# Patient Record
Sex: Male | Born: 1990
Health system: Southern US, Community
[De-identification: ages and names within clinical notes are randomized; demographics above are authoritative.]

## PROBLEM LIST (undated history)

## (undated) DIAGNOSIS — F32A Depression, unspecified: Secondary | ICD-10-CM

## (undated) DIAGNOSIS — F419 Anxiety disorder, unspecified: Secondary | ICD-10-CM

## (undated) DIAGNOSIS — F988 Other specified behavioral and emotional disorders with onset usually occurring in childhood and adolescence: Secondary | ICD-10-CM

## (undated) DIAGNOSIS — F329 Major depressive disorder, single episode, unspecified: Secondary | ICD-10-CM

## (undated) DIAGNOSIS — T7840XA Allergy, unspecified, initial encounter: Secondary | ICD-10-CM

## (undated) HISTORY — PX: GASTRIC OUTLET OBSTRUCTION RELEASE: SHX5247

## (undated) HISTORY — DX: Anxiety disorder, unspecified: F41.9

## (undated) HISTORY — PX: LYMPHADENECTOMY: SHX15

## (undated) HISTORY — DX: Major depressive disorder, single episode, unspecified: F32.9

## (undated) HISTORY — DX: Other specified behavioral and emotional disorders with onset usually occurring in childhood and adolescence: F98.8

## (undated) HISTORY — PX: OTHER SURGICAL HISTORY: SHX169

## (undated) HISTORY — DX: Depression, unspecified: F32.A

## (undated) HISTORY — DX: Allergy, unspecified, initial encounter: T78.40XA

---
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
## 2010-01-05 ENCOUNTER — Emergency Department: Payer: Self-pay | Admitting: Emergency Medicine

## 2012-11-08 ENCOUNTER — Emergency Department: Payer: Self-pay | Admitting: Emergency Medicine

## 2012-11-08 LAB — DRUG SCREEN, URINE
Amphetamines, Ur Screen: NEGATIVE (ref ?–1000)
Cannabinoid 50 Ng, Ur ~~LOC~~: POSITIVE (ref ?–50)
Cocaine Metabolite,Ur ~~LOC~~: NEGATIVE (ref ?–300)
Methadone, Ur Screen: NEGATIVE (ref ?–300)
Phencyclidine (PCP) Ur S: NEGATIVE (ref ?–25)
Tricyclic, Ur Screen: NEGATIVE (ref ?–1000)

## 2012-11-08 LAB — CBC
HGB: 17.6 g/dL (ref 13.0–18.0)
Platelet: 321 10*3/uL (ref 150–440)
RDW: 13.4 % (ref 11.5–14.5)

## 2012-11-08 LAB — COMPREHENSIVE METABOLIC PANEL
Alkaline Phosphatase: 109 U/L (ref 50–136)
Anion Gap: 7 (ref 7–16)
BUN: 10 mg/dL (ref 7–18)
Bilirubin,Total: 0.8 mg/dL (ref 0.2–1.0)
Calcium, Total: 10 mg/dL (ref 8.5–10.1)
Co2: 27 mmol/L (ref 21–32)
EGFR (Non-African Amer.): >60
Glucose: 92 mg/dL (ref 65–99)
Osmolality: 273 (ref 275–301)
SGPT (ALT): 20 U/L (ref 12–78)
Sodium: 137 mmol/L (ref 136–145)

## 2012-11-08 LAB — URINALYSIS, COMPLETE
Blood: NEGATIVE
Glucose,UR: NEGATIVE mg/dL (ref 0–75)
Leukocyte Esterase: NEGATIVE
Nitrite: NEGATIVE
Ph: 7 (ref 4.5–8.0)
Protein: NEGATIVE
RBC,UR: 2 /HPF (ref 0–5)
Squamous Epithelial: NONE SEEN

## 2012-11-08 LAB — ACETAMINOPHEN LEVEL: Acetaminophen: <2 ug/mL

## 2012-11-08 LAB — ETHANOL
Ethanol %: <0.003 % (ref 0.000–0.080)
Ethanol: <3 mg/dL

## 2013-01-03 ENCOUNTER — Emergency Department: Payer: Self-pay | Admitting: Emergency Medicine

## 2013-03-20 ENCOUNTER — Emergency Department: Payer: Self-pay | Admitting: Emergency Medicine

## 2013-07-31 ENCOUNTER — Emergency Department: Payer: Self-pay | Admitting: Emergency Medicine

## 2013-07-31 LAB — BASIC METABOLIC PANEL
Anion Gap: 5 — ABNORMAL LOW (ref 7–16)
BUN: 16 mg/dL (ref 7–18)
CALCIUM: 9 mg/dL (ref 8.5–10.1)
CREATININE: 1.08 mg/dL (ref 0.60–1.30)
Chloride: 107 mmol/L (ref 98–107)
Co2: 27 mmol/L (ref 21–32)
EGFR (African American): >60
Glucose: 127 mg/dL — ABNORMAL HIGH (ref 65–99)
OSMOLALITY: 280 (ref 275–301)
POTASSIUM: 3.4 mmol/L — AB (ref 3.5–5.1)
SODIUM: 139 mmol/L (ref 136–145)

## 2013-07-31 LAB — CBC
HCT: 44.6 % (ref 40.0–52.0)
HGB: 14.5 g/dL (ref 13.0–18.0)
MCH: 25.3 pg — AB (ref 26.0–34.0)
MCHC: 32.6 g/dL (ref 32.0–36.0)
MCV: 78 fL — AB (ref 80–100)
Platelet: 256 10*3/uL (ref 150–440)
RBC: 5.75 10*6/uL (ref 4.40–5.90)
RDW: 13.5 % (ref 11.5–14.5)
WBC: 10.3 10*3/uL (ref 3.8–10.6)

## 2013-07-31 LAB — T4, FREE: FREE THYROXINE: 1.02 ng/dL (ref 0.76–1.46)

## 2013-07-31 LAB — TSH: Thyroid Stimulating Horm: 0.896 u[IU]/mL

## 2013-07-31 LAB — MAGNESIUM: Magnesium: 1.9 mg/dL

## 2013-09-30 ENCOUNTER — Emergency Department: Payer: Self-pay
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
## 2015-01-28 ENCOUNTER — Ambulatory Visit (INDEPENDENT_AMBULATORY_CARE_PROVIDER_SITE_OTHER): Payer: BLUE CROSS/BLUE SHIELD | Admitting: Family Medicine

## 2015-01-28 ENCOUNTER — Encounter: Payer: Self-pay | Admitting: Family Medicine

## 2015-01-28 VITALS — BP 102/64 | HR 73 | Temp 98.3°F | Ht 64.7 in | Wt 129.0 lb

## 2015-01-28 DIAGNOSIS — F32A Depression, unspecified: Secondary | ICD-10-CM | POA: Insufficient documentation

## 2015-01-28 DIAGNOSIS — F329 Major depressive disorder, single episode, unspecified: Secondary | ICD-10-CM | POA: Diagnosis not present

## 2015-01-28 LAB — URINALYSIS, ROUTINE W REFLEX MICROSCOPIC
Bilirubin, UA: NEGATIVE
Glucose, UA: NEGATIVE
KETONES UA: NEGATIVE
LEUKOCYTES UA: NEGATIVE
Nitrite, UA: NEGATIVE
Protein, UA: NEGATIVE
RBC, UA: NEGATIVE
Specific Gravity, UA: 1.015 (ref 1.005–1.030)
Urobilinogen, Ur: 0.2 mg/dL (ref 0.2–1.0)
pH, UA: 7.5 (ref 5.0–7.5)

## 2015-01-28 MED ORDER — CITALOPRAM HYDROBROMIDE 20 MG PO TABS: 20.0000 mg | ORAL_TABLET | Freq: Every day | ORAL | Status: DC

## 2015-01-28 NOTE — Progress Notes (Signed)
BP 102/64 mmHg  Pulse 73  Temp(Src) 98.3 F (36.8 C)  Ht 5' 4.7" (1.643 m)  Wt 129 lb (58.514 kg)  BMI 21.68 kg/m2  SpO2 99%   Subjective:    Patient ID: Connor Wilson, male    DOB: [birth year], 24 y.o.   MRN: [id number]  HPI: Connor Wilson is a 24 y.o. male  Chief Complaint  Patient presents with  . Depression   patient had previously been on citalopram for depression through [place] with misunderstandings has been out of his medicine for about a month depression is gotten bad again. Patient didn't realize how good he had it on medications and wants to get back on citalopram 20 mg once a day. Patient also thought he is ready to get off but life and reality he has gotten in the way. Discussed suicide with patient  patient not suicidal  Relevant past medical, surgical, family and social history reviewed and updated as indicated. Interim medical history since our last visit reviewed. Allergies and medications reviewed and updated.  Review of Systems  Constitutional: Positive for fatigue.  Respiratory: Negative.   Cardiovascular: Negative.   Gastrointestinal: Positive for nausea.  Psychiatric/Behavioral: Positive for sleep disturbance, dysphoric mood and decreased concentration. Negative for suicidal ideas, hallucinations, behavioral problems, confusion, self-injury and agitation. The patient is nervous/anxious.     Per HPI unless specifically indicated above     Objective:    BP 102/64 mmHg  Pulse 73  Temp(Src) 98.3 F (36.8 C)  Ht 5' 4.7" (1.643 m)  Wt 129 lb (58.514 kg)  BMI 21.68 kg/m2  SpO2 99%  Wt Readings from Last 3 Encounters:  01/28/15 129 lb (58.514 kg)    Physical Exam  Constitutional: He is oriented to person, place, and time. He appears well-developed and well-nourished. No distress.  HENT:  Head: Normocephalic and atraumatic.  Right Ear: Hearing normal.  Left Ear: Hearing normal.  Nose: Nose normal.  Eyes: Conjunctivae and lids are normal. Right  eye exhibits no discharge. Left eye exhibits no discharge. No scleral icterus.  Cardiovascular: Normal rate, regular rhythm and normal heart sounds.   Pulmonary/Chest: Effort normal and breath sounds normal. No respiratory distress.  Musculoskeletal: Normal range of motion.  Neurological: He is alert and oriented to person, place, and time.  Skin: Skin is intact. No rash noted.  Psychiatric: He has a normal mood and affect. His speech is normal and behavior is normal. Judgment and thought content normal. Cognition and memory are normal.        Assessment & Plan:   Problem List Items Addressed This Visit      Other   Depression - Primary    Discussed depression and restarting citalopram Discussed possibility of worsening symptoms with poor sleep and increasing agitation Patient will come back sooner if similar problems but patient has been on this medicine before without problems. Discussed potential worse symptoms of depression and suicide patient will notify us if any of those concerns Patient has not had lab work in years we will check today      Relevant Medications   citalopram (CELEXA) 20 MG tablet   Other Relevant Orders   Comprehensive metabolic panel   CBC with Differential/Platelet   TSH   Urinalysis, Routine w reflex microscopic (not at [facility])       Follow up plan: Return in about 4 weeks (around 02/25/2015), or if symptoms worsen or fail to improve, for Recheck depression.

## 2015-01-28 NOTE — Assessment & Plan Note (Addendum)
Discussed depression and restarting citalopram Discussed possibility of worsening symptoms with poor sleep and increasing agitation Patient will come back sooner if similar problems but patient has been on this medicine before without problems. Discussed potential worse symptoms of depression and suicide patient will notify us if any of those concerns Patient has not had lab work in years we will check today

## 2015-01-29 ENCOUNTER — Encounter: Payer: Self-pay | Admitting: Family Medicine

## 2015-01-29 LAB — COMPREHENSIVE METABOLIC PANEL
A/G RATIO: 2.9 — AB (ref 1.1–2.5)
ALT: 15 IU/L (ref 0–44)
AST: 20 IU/L (ref 0–40)
Albumin: 5 g/dL (ref 3.5–5.5)
Alkaline Phosphatase: 85 IU/L (ref 39–117)
BILIRUBIN TOTAL: 0.4 mg/dL (ref 0.0–1.2)
BUN / CREAT RATIO: 12 (ref 8–19)
BUN: 12 mg/dL (ref 6–20)
CO2: 25 mmol/L (ref 18–29)
Calcium: 9.6 mg/dL (ref 8.7–10.2)
Chloride: 98 mmol/L (ref 97–108)
Creatinine, Ser: 1.01 mg/dL (ref 0.76–1.27)
GFR calc non Af Amer: 104 mL/min/{1.73_m2} (ref >59)
GFR, EST AFRICAN AMERICAN: 121 mL/min/{1.73_m2} (ref >59)
Globulin, Total: 1.7 g/dL (ref 1.5–4.5)
Glucose: 86 mg/dL (ref 65–99)
POTASSIUM: 4.3 mmol/L (ref 3.5–5.2)
SODIUM: 142 mmol/L (ref 134–144)
TOTAL PROTEIN: 6.7 g/dL (ref 6.0–8.5)

## 2015-01-29 LAB — CBC WITH DIFFERENTIAL/PLATELET
BASOS ABS: 0.1 10*3/uL (ref 0.0–0.2)
Basos: 1 %
EOS (ABSOLUTE): 0.1 10*3/uL (ref 0.0–0.4)
Eos: 2 %
Hematocrit: 44.3 % (ref 37.5–51.0)
Hemoglobin: 14.7 g/dL (ref 12.6–17.7)
Immature Grans (Abs): 0 10*3/uL (ref 0.0–0.1)
Immature Granulocytes: 0 %
Lymphocytes Absolute: 2.3 10*3/uL (ref 0.7–3.1)
Lymphs: 36 %
MCH: 25.7 pg — AB (ref 26.6–33.0)
MCHC: 33.2 g/dL (ref 31.5–35.7)
MCV: 78 fL — ABNORMAL LOW (ref 79–97)
MONOS ABS: 0.5 10*3/uL (ref 0.1–0.9)
Monocytes: 8 %
NEUTROS ABS: 3.5 10*3/uL (ref 1.4–7.0)
Neutrophils: 53 %
PLATELETS: 268 10*3/uL (ref 150–379)
RBC: 5.71 x10E6/uL (ref 4.14–5.80)
RDW: 14.3 % (ref 12.3–15.4)
WBC: 6.5 10*3/uL (ref 3.4–10.8)

## 2015-01-29 LAB — TSH: TSH: 1.94 u[IU]/mL (ref 0.450–4.500)

## 2015-03-05 ENCOUNTER — Ambulatory Visit (INDEPENDENT_AMBULATORY_CARE_PROVIDER_SITE_OTHER): Payer: BLUE CROSS/BLUE SHIELD | Admitting: Family Medicine

## 2015-03-05 ENCOUNTER — Encounter: Payer: Self-pay | Admitting: Family Medicine

## 2015-03-05 VITALS — BP 132/64 | HR 83 | Temp 98.8°F | Ht 62.25 in | Wt 132.6 lb

## 2015-03-05 DIAGNOSIS — F329 Major depressive disorder, single episode, unspecified: Secondary | ICD-10-CM | POA: Diagnosis not present

## 2015-03-05 DIAGNOSIS — L309 Dermatitis, unspecified: Secondary | ICD-10-CM | POA: Diagnosis not present

## 2015-03-05 DIAGNOSIS — F32A Depression, unspecified: Secondary | ICD-10-CM

## 2015-03-05 MED ORDER — TRIAMCINOLONE ACETONIDE 0.1 % EX CREA: 1.0000 "application " | TOPICAL_CREAM | Freq: Two times a day (BID) | CUTANEOUS | Status: DC

## 2015-03-05 NOTE — Assessment & Plan Note (Signed)
Discussed care and treatment will give triamcinolone with warnings about not using for more than 2 weeks and same place

## 2015-03-05 NOTE — Progress Notes (Signed)
BP 132/64 mmHg  Pulse 83  Temp(Src) 98.8 F (37.1 C)  Ht 5' 2.25" (1.581 m)  Wt 132 lb 9.6 oz (60.147 kg)  BMI 24.06 kg/m2  SpO2 99%   Subjective:    Patient ID: Connor Wilson, male    DOB: February 19, 1991, 24 y.o.   MRN: [id number]  HPI: Connor Wilson is a 24 y.o. male  Chief Complaint  Patient presents with  . Follow-up   Patient doing well had very stressful month with court appearance and time in jail for one week. Patient is gotten through that. Still some residual anxiety but doing better. Takes citalopram without problems  Patient's also with some eczema changes on his flanks has been present for over a year sometimes itches with dry skin.  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 No real fungal changes of nails.  Relevant past medical, surgical, family and social history reviewed and updated as indicated. Interim medical history since our last visit reviewed. Allergies and medications reviewed and updated.  Review of Systems  Constitutional: Negative.   Respiratory: Negative.   Cardiovascular: Negative.     Per HPI unless specifically indicated above     Objective:    BP 132/64 mmHg  Pulse 83  Temp(Src) 98.8 F (37.1 C)  Ht 5' 2.25" (1.581 m)  Wt 132 lb 9.6 oz (60.147 kg)  BMI 24.06 kg/m2  SpO2 99%  Wt Readings from Last 3 Encounters:  03/05/15 132 lb 9.6 oz (60.147 kg)  10/21/10 124 lb (56.246 kg) (7 %*, Z = -1.50)  01/28/15 129 lb (58.514 kg)   * Growth percentiles are based on CDC 2-20 Years data.    Physical Exam  Constitutional: He is oriented to person, place, and time. He appears well-developed and well-nourished. No distress.  HENT:  Head: Normocephalic and atraumatic.  Right Ear: Hearing normal.  Left Ear: Hearing normal.  Nose: Nose normal.  Eyes: Conjunctivae and lids are normal. Right eye exhibits no discharge. Left eye exhibits no discharge. No scleral icterus.   Cardiovascular: Normal rate, regular rhythm and normal heart sounds.   Pulmonary/Chest: Effort normal and breath sounds normal. No respiratory distress.  Musculoskeletal: Normal range of motion.  Neurological: He is alert and oriented to person, place, and time.  Skin: Skin is intact. No rash noted.  No changes of onychomycosis presently Eczema changes both flanks  Psychiatric: He has a normal mood and affect. His speech is normal and behavior is normal. Judgment and thought content normal. Cognition and memory are normal.    Results for orders placed or performed in visit on 01/28/15  Comprehensive metabolic panel  Result Value Ref Range   Glucose 86 65 - 99 mg/dL   BUN 12 6 - 20 mg/dL   Creatinine, Ser 1.01 0.76 - 1.27 mg/dL   GFR calc non Af Amer 104 >59 mL/min/1.73   GFR calc Af Amer 121 >59 mL/min/1.73   BUN/Creatinine Ratio 12 8 - 19   Sodium 142 134 - 144 mmol/L   Potassium 4.3 3.5 - 5.2 mmol/L   Chloride 98 97 - 108 mmol/L   CO2 25 18 - 29 mmol/L   Calcium 9.6 8.7 - 10.2 mg/dL   Total Protein 6.7 6.0 - 8.5 g/dL   Albumin 5.0 3.5 - 5.5 g/dL   Globulin, Total 1.7 1.5 - 4.5 g/dL   Albumin/Globulin Ratio 2.9 (H) 1.1 - 2.5   Bilirubin Total  0.4 0.0 - 1.2 mg/dL   Alkaline Phosphatase 85 39 - 117 IU/L   AST 20 0 - 40 IU/L   ALT 15 0 - 44 IU/L  CBC with Differential/Platelet  Result Value Ref Range   WBC 6.5 3.4 - 10.8 x10E3/uL   RBC 5.71 4.14 - 5.80 x10E6/uL   Hemoglobin 14.7 12.6 - 17.7 g/dL   Hematocrit 44.3 37.5 - 51.0 %   MCV 78 (L) 79 - 97 fL   MCH 25.7 (L) 26.6 - 33.0 pg   MCHC 33.2 31.5 - 35.7 g/dL   RDW 14.3 12.3 - 15.4 %   Platelets 268 150 - 379 x10E3/uL   Neutrophils 53 %   Lymphs 36 %   Monocytes 8 %   Eos 2 %   Basos 1 %   Neutrophils Absolute 3.5 1.4 - 7.0 x10E3/uL   Lymphocytes Absolute 2.3 0.7 - 3.1 x10E3/uL   Monocytes Absolute 0.5 0.1 - 0.9 x10E3/uL   EOS (ABSOLUTE) 0.1 0.0 - 0.4 x10E3/uL   Basophils Absolute 0.1 0.0 - 0.2 x10E3/uL   Immature  Granulocytes 0 %   Immature Grans (Abs) 0.0 0.0 - 0.1 x10E3/uL  TSH  Result Value Ref Range   TSH 1.940 0.450 - 4.500 uIU/mL  Urinalysis, Routine w reflex microscopic (not at [facility])  Result Value Ref Range   Specific Gravity, UA 1.015 1.005 - 1.030   pH, UA 7.5 5.0 - 7.5   Color, UA Yellow Yellow   Appearance Ur Clear Clear   Leukocytes, UA Negative Negative   Protein, UA Negative Negative/Trace   Glucose, UA Negative Negative   Ketones, UA Negative Negative   RBC, UA Negative Negative   Bilirubin, UA Negative Negative   Urobilinogen, Ur 0.2 0.2 - 1.0 mg/dL   Nitrite, UA Negative Negative      Assessment & Plan:   Problem List Items Addressed This Visit      Musculoskeletal and Integument   Eczema    Discussed care and treatment will give triamcinolone with warnings about not using for more than 2 weeks and same place      Relevant Medications   triamcinolone cream (KENALOG) 0.1 %     Other   Depression - Primary    The current medical regimen is effective;  continue present plan and medications. Stress anxiety slowly improving with resolution of situation. Patient back at work and doing well.          Follow up plan: Return for Physical Exam this winter.

## 2015-03-05 NOTE — Assessment & Plan Note (Signed)
The current medical regimen is effective;  continue present plan and medications. Stress anxiety slowly improving with resolution of situation. Patient back at work and doing well.

## 2015-09-18 ENCOUNTER — Other Ambulatory Visit: Payer: Self-pay | Admitting: Family Medicine

## 2015-09-23 ENCOUNTER — Ambulatory Visit: Payer: BLUE CROSS/BLUE SHIELD | Admitting: Family Medicine

## 2015-09-29 ENCOUNTER — Ambulatory Visit (INDEPENDENT_AMBULATORY_CARE_PROVIDER_SITE_OTHER): Payer: BLUE CROSS/BLUE SHIELD | Admitting: Family Medicine

## 2015-09-29 ENCOUNTER — Encounter: Payer: Self-pay | Admitting: Family Medicine

## 2015-09-29 VITALS — BP 117/68 | HR 68 | Temp 98.0°F | Ht 65.0 in | Wt 137.0 lb

## 2015-09-29 DIAGNOSIS — F329 Major depressive disorder, single episode, unspecified: Secondary | ICD-10-CM | POA: Diagnosis not present

## 2015-09-29 DIAGNOSIS — F32A Depression, unspecified: Secondary | ICD-10-CM

## 2015-09-29 MED ORDER — CITALOPRAM HYDROBROMIDE 40 MG PO TABS: 40.0000 mg | ORAL_TABLET | Freq: Every day | ORAL | Status: DC

## 2015-09-29 NOTE — Assessment & Plan Note (Signed)
Depression all in all doing well maybe can do better will try increasing citalopram from 20 mg to 40 mg

## 2015-09-29 NOTE — Progress Notes (Signed)
BP 117/68 mmHg  Pulse 68  Temp(Src) 98 F (36.7 C)  Ht 5\' 5"  (1.651 m)  Wt 137 lb (62.143 kg)  BMI 22.80 kg/m2  SpO2 96%   Subjective:    Patient ID: Connor Wilson, male    DOB: 10-23-1990, 25 y.o.   MRN: [id number]  HPI: Connor Wilson is a 25 y.o. male  Chief Complaint  Patient presents with  . Depression  Discussed with patient doing well except reviewed depression screener score of 13 some sleeping issues and energy issues on nearly every day. No side effects from citalopram 20 mg and work is going okay. Has routine issues but nothing special Patient also has some hemorrhoids tried some home remedies and as recommended on the Internet which is done a little bit Relevant past medical, surgical, family and social history reviewed and updated as indicated. Interim medical history since our last visit reviewed. Allergies and medications reviewed and updated.  Review of Systems  Constitutional: Negative.   Respiratory: Negative.   Cardiovascular: Negative.     Per HPI unless specifically indicated above     Objective:    BP 117/68 mmHg  Pulse 68  Temp(Src) 98 F (36.7 C)  Ht 5\' 5"  (1.651 m)  Wt 137 lb (62.143 kg)  BMI 22.80 kg/m2  SpO2 96%  Wt Readings from Last 3 Encounters:  09/29/15 137 lb (62.143 kg)  03/05/15 132 lb 9.6 oz (60.147 kg)  10/21/10 124 lb (56.246 kg) (7 %*, Z = -1.50)   * Growth percentiles are based on CDC 2-20 Years data.    Physical Exam  Constitutional: He is oriented to person, place, and time. He appears well-developed and well-nourished. No distress.  HENT:  Head: Normocephalic and atraumatic.  Right Ear: Hearing normal.  Left Ear: Hearing normal.  Nose: Nose normal.  Eyes: Conjunctivae and lids are normal. Right eye exhibits no discharge. Left eye exhibits no discharge. No scleral icterus.  Cardiovascular: Normal rate, regular rhythm and normal heart sounds.   Pulmonary/Chest: Effort normal and breath sounds normal. No  respiratory distress.  Genitourinary:  Small external hemorrhoid nonthrombosed  Musculoskeletal: Normal range of motion.  Neurological: He is alert and oriented to person, place, and time.  Skin: Skin is intact. No rash noted.  Psychiatric: He has a normal mood and affect. His speech is normal and behavior is normal. Judgment and thought content normal. Cognition and memory are normal.    Results for orders placed or performed in visit on 01/28/15  Comprehensive metabolic panel  Result Value Ref Range   Glucose 86 65 - 99 mg/dL   BUN 12 6 - 20 mg/dL   Creatinine, Ser 1.01 0.76 - 1.27 mg/dL   GFR calc non Af Amer 104 >59 mL/min/1.73   GFR calc Af Amer 121 >59 mL/min/1.73   BUN/Creatinine Ratio 12 8 - 19   Sodium 142 134 - 144 mmol/L   Potassium 4.3 3.5 - 5.2 mmol/L   Chloride 98 97 - 108 mmol/L   CO2 25 18 - 29 mmol/L   Calcium 9.6 8.7 - 10.2 mg/dL   Total Protein 6.7 6.0 - 8.5 g/dL   Albumin 5.0 3.5 - 5.5 g/dL   Globulin, Total 1.7 1.5 - 4.5 g/dL   Albumin/Globulin Ratio 2.9 (H) 1.1 - 2.5   Bilirubin Total 0.4 0.0 - 1.2 mg/dL   Alkaline Phosphatase 85 39 - 117 IU/L   AST 20 0 - 40 IU/L   ALT 15 0 - 44  IU/L  CBC with Differential/Platelet  Result Value Ref Range   WBC 6.5 3.4 - 10.8 x10E3/uL   RBC 5.71 4.14 - 5.80 x10E6/uL   Hemoglobin 14.7 12.6 - 17.7 g/dL   Hematocrit 44.3 37.5 - 51.0 %   MCV 78 (L) 79 - 97 fL   MCH 25.7 (L) 26.6 - 33.0 pg   MCHC 33.2 31.5 - 35.7 g/dL   RDW 14.3 12.3 - 15.4 %   Platelets 268 150 - 379 x10E3/uL   Neutrophils 53 %   Lymphs 36 %   Monocytes 8 %   Eos 2 %   Basos 1 %   Neutrophils Absolute 3.5 1.4 - 7.0 x10E3/uL   Lymphocytes Absolute 2.3 0.7 - 3.1 x10E3/uL   Monocytes Absolute 0.5 0.1 - 0.9 x10E3/uL   EOS (ABSOLUTE) 0.1 0.0 - 0.4 x10E3/uL   Basophils Absolute 0.1 0.0 - 0.2 x10E3/uL   Immature Granulocytes 0 %   Immature Grans (Abs) 0.0 0.0 - 0.1 x10E3/uL  TSH  Result Value Ref Range   TSH 1.940 0.450 - 4.500 uIU/mL  Urinalysis,  Routine w reflex microscopic (not at [facility])  Result Value Ref Range   Specific Gravity, UA 1.015 1.005 - 1.030   pH, UA 7.5 5.0 - 7.5   Color, UA Yellow Yellow   Appearance Ur Clear Clear   Leukocytes, UA Negative Negative   Protein, UA Negative Negative/Trace   Glucose, UA Negative Negative   Ketones, UA Negative Negative   RBC, UA Negative Negative   Bilirubin, UA Negative Negative   Urobilinogen, Ur 0.2 0.2 - 1.0 mg/dL   Nitrite, UA Negative Negative      Assessment & Plan:   Problem List Items Addressed This Visit      Other   Depression - Primary    Depression all in all doing well maybe can do better will try increasing citalopram from 20 mg to 40 mg      Relevant Medications   citalopram (CELEXA) 40 MG tablet      Discuss hemorrhoid care and treatment use of over-the-counter creams  Follow up plan: Return in about 6 months (around 03/30/2016) for Physical Exam.

## 2015-10-03 ENCOUNTER — Telehealth: Payer: Self-pay | Admitting: Family Medicine

## 2015-10-03 NOTE — Telephone Encounter (Signed)
Erroneous entry

## 2016-03-05 ENCOUNTER — Encounter (INDEPENDENT_AMBULATORY_CARE_PROVIDER_SITE_OTHER): Payer: Self-pay

## 2016-03-30 ENCOUNTER — Encounter: Payer: Self-pay | Admitting: Family Medicine

## 2016-03-30 ENCOUNTER — Ambulatory Visit (INDEPENDENT_AMBULATORY_CARE_PROVIDER_SITE_OTHER): Payer: BLUE CROSS/BLUE SHIELD | Admitting: Family Medicine

## 2016-03-30 VITALS — BP 129/76 | HR 73 | Temp 98.4°F | Ht 65.0 in | Wt 145.0 lb

## 2016-03-30 DIAGNOSIS — L309 Dermatitis, unspecified: Secondary | ICD-10-CM

## 2016-03-30 DIAGNOSIS — F3342 Major depressive disorder, recurrent, in full remission: Secondary | ICD-10-CM

## 2016-03-30 DIAGNOSIS — Z0000 Encounter for general adult medical examination without abnormal findings: Secondary | ICD-10-CM

## 2016-03-30 LAB — URINALYSIS, ROUTINE W REFLEX MICROSCOPIC
Bilirubin, UA: NEGATIVE
Glucose, UA: NEGATIVE
KETONES UA: NEGATIVE
LEUKOCYTES UA: NEGATIVE
NITRITE UA: NEGATIVE
Protein, UA: NEGATIVE
RBC UA: NEGATIVE
SPEC GRAV UA: 1.015 (ref 1.005–1.030)
UUROB: 0.2 mg/dL (ref 0.2–1.0)
pH, UA: 7 (ref 5.0–7.5)

## 2016-03-30 MED ORDER — CITALOPRAM HYDROBROMIDE 40 MG PO TABS: 40.0000 mg | ORAL_TABLET | Freq: Every day | ORAL | 12 refills | Status: DC

## 2016-03-30 MED ORDER — TRIAMCINOLONE ACETONIDE 0.1 % EX CREA: 1.0000 "application " | TOPICAL_CREAM | Freq: Two times a day (BID) | CUTANEOUS | 0 refills | Status: DC

## 2016-03-30 NOTE — Progress Notes (Signed)
BP 129/76   Pulse 73   Temp 98.4 F (36.9 C)   Ht 5\' 5"  (1.651 m)   Wt 145 lb (65.8 kg)   SpO2 99%   BMI 24.13 kg/m    Subjective:    Patient ID: Connor Wilson, male    DOB: September 30, 1990, 25 y.o.   MRN: [id number]  HPI: Connor Wilson is a 25 y.o. male  Chief Complaint  Patient presents with  . Annual Exam   Patient all in all doing well taken citalopram without issues Has eczema and uses occasional triamcinolone cream and does well. Relevant past medical, surgical, family and social history reviewed and updated as indicated. Interim medical history since our last visit reviewed. Allergies and medications reviewed and updated.  Review of Systems  Constitutional: Negative.   HENT: Negative.   Eyes: Negative.   Respiratory: Negative.   Cardiovascular: Negative.   Gastrointestinal: Negative.   Endocrine: Negative.   Genitourinary: Negative.   Musculoskeletal: Negative.   Skin: Negative.   Allergic/Immunologic: Negative.   Neurological: Negative.   Hematological: Negative.   Psychiatric/Behavioral: Negative.     Per HPI unless specifically indicated above     Objective:    BP 129/76   Pulse 73   Temp 98.4 F (36.9 C)   Ht 5\' 5"  (1.651 m)   Wt 145 lb (65.8 kg)   SpO2 99%   BMI 24.13 kg/m   Wt Readings from Last 3 Encounters:  03/30/16 145 lb (65.8 kg)  09/29/15 137 lb (62.1 kg)  03/05/15 132 lb 9.6 oz (60.1 kg)    Physical Exam  Constitutional: He is oriented to person, place, and time. He appears well-developed and well-nourished.  HENT:  Head: Normocephalic.  Right Ear: External ear normal.  Left Ear: External ear normal.  Nose: Nose normal.  Eyes: Conjunctivae and EOM are normal. Pupils are equal, round, and reactive to light.  Neck: Normal range of motion. Neck supple. No thyromegaly present.  Cardiovascular: Normal rate, regular rhythm, normal heart sounds and intact distal pulses.   Pulmonary/Chest: Effort normal and breath sounds normal.    Abdominal: Soft. Bowel sounds are normal. There is no splenomegaly or hepatomegaly.  Genitourinary: Penis normal.  Musculoskeletal: Normal range of motion.  Lymphadenopathy:    He has no cervical adenopathy.  Neurological: He is alert and oriented to person, place, and time. He has normal reflexes.  Skin: Skin is warm and dry.  Psychiatric: He has a normal mood and affect. His behavior is normal. Judgment and thought content normal.    Results for orders placed or performed in visit on 01/28/15  Comprehensive metabolic panel  Result Value Ref Range   Glucose 86 65 - 99 mg/dL   BUN 12 6 - 20 mg/dL   Creatinine, Ser 1.01 0.76 - 1.27 mg/dL   GFR calc non Af Amer 104 >59 mL/min/1.73   GFR calc Af Amer 121 >59 mL/min/1.73   BUN/Creatinine Ratio 12 8 - 19   Sodium 142 134 - 144 mmol/L   Potassium 4.3 3.5 - 5.2 mmol/L   Chloride 98 97 - 108 mmol/L   CO2 25 18 - 29 mmol/L   Calcium 9.6 8.7 - 10.2 mg/dL   Total Protein 6.7 6.0 - 8.5 g/dL   Albumin 5.0 3.5 - 5.5 g/dL   Globulin, Total 1.7 1.5 - 4.5 g/dL   Albumin/Globulin Ratio 2.9 (H) 1.1 - 2.5   Bilirubin Total 0.4 0.0 - 1.2 mg/dL   Alkaline Phosphatase 85  39 - 117 IU/L   AST 20 0 - 40 IU/L   ALT 15 0 - 44 IU/L  CBC with Differential/Platelet  Result Value Ref Range   WBC 6.5 3.4 - 10.8 x10E3/uL   RBC 5.71 4.14 - 5.80 x10E6/uL   Hemoglobin 14.7 12.6 - 17.7 g/dL   Hematocrit 44.3 37.5 - 51.0 %   MCV 78 (L) 79 - 97 fL   MCH 25.7 (L) 26.6 - 33.0 pg   MCHC 33.2 31.5 - 35.7 g/dL   RDW 14.3 12.3 - 15.4 %   Platelets 268 150 - 379 x10E3/uL   Neutrophils 53 %   Lymphs 36 %   Monocytes 8 %   Eos 2 %   Basos 1 %   Neutrophils Absolute 3.5 1.4 - 7.0 x10E3/uL   Lymphocytes Absolute 2.3 0.7 - 3.1 x10E3/uL   Monocytes Absolute 0.5 0.1 - 0.9 x10E3/uL   EOS (ABSOLUTE) 0.1 0.0 - 0.4 x10E3/uL   Basophils Absolute 0.1 0.0 - 0.2 x10E3/uL   Immature Granulocytes 0 %   Immature Grans (Abs) 0.0 0.0 - 0.1 x10E3/uL  TSH  Result Value Ref  Range   TSH 1.940 0.450 - 4.500 uIU/mL  Urinalysis, Routine w reflex microscopic (not at [facility])  Result Value Ref Range   Specific Gravity, UA 1.015 1.005 - 1.030   pH, UA 7.5 5.0 - 7.5   Color, UA Yellow Yellow   Appearance Ur Clear Clear   Leukocytes, UA Negative Negative   Protein, UA Negative Negative/Trace   Glucose, UA Negative Negative   Ketones, UA Negative Negative   RBC, UA Negative Negative   Bilirubin, UA Negative Negative   Urobilinogen, Ur 0.2 0.2 - 1.0 mg/dL   Nitrite, UA Negative Negative      Assessment & Plan:   Problem List Items Addressed This Visit      Musculoskeletal and Integument   Eczema    The current medical regimen is effective;  continue present plan and medications.       Relevant Medications   triamcinolone cream (KENALOG) 0.1 %     Other   Depression    The current medical regimen is effective;  continue present plan and medications.       Relevant Medications   citalopram (CELEXA) 40 MG tablet    Other Visit Diagnoses    Routine general medical examination at a health care facility    -  Primary   Relevant Orders   CBC with Differential/Platelet   Comprehensive metabolic panel   Urinalysis, Routine w reflex microscopic (not at [facility])   Lipid Profile   TSH       Follow up plan: Return in about 1 year (around 03/30/2017).

## 2016-03-30 NOTE — Assessment & Plan Note (Signed)
The current medical regimen is effective;  continue present plan and medications.  

## 2016-03-31 ENCOUNTER — Encounter: Payer: Self-pay | Admitting: Family Medicine

## 2016-03-31 LAB — LIPID PANEL
CHOL/HDL RATIO: 3.8 ratio (ref 0.0–5.0)
Cholesterol, Total: 204 mg/dL — ABNORMAL HIGH (ref 100–199)
HDL: 54 mg/dL (ref >39)
LDL Calculated: 94 mg/dL (ref 0–99)
TRIGLYCERIDES: 278 mg/dL — AB (ref 0–149)
VLDL Cholesterol Cal: 56 mg/dL — ABNORMAL HIGH (ref 5–40)

## 2016-03-31 LAB — CBC WITH DIFFERENTIAL/PLATELET
BASOS: 0 %
Basophils Absolute: 0 10*3/uL (ref 0.0–0.2)
EOS (ABSOLUTE): 0.2 10*3/uL (ref 0.0–0.4)
Eos: 2 %
Hematocrit: 44.2 % (ref 37.5–51.0)
Hemoglobin: 14.9 g/dL (ref 12.6–17.7)
IMMATURE GRANULOCYTES: 0 %
Immature Grans (Abs): 0 10*3/uL (ref 0.0–0.1)
LYMPHS ABS: 2.9 10*3/uL (ref 0.7–3.1)
Lymphs: 36 %
MCH: 26.2 pg — AB (ref 26.6–33.0)
MCHC: 33.7 g/dL (ref 31.5–35.7)
MCV: 78 fL — AB (ref 79–97)
MONOS ABS: 0.6 10*3/uL (ref 0.1–0.9)
Monocytes: 8 %
NEUTROS ABS: 4.4 10*3/uL (ref 1.4–7.0)
NEUTROS PCT: 54 %
PLATELETS: 253 10*3/uL (ref 150–379)
RBC: 5.69 x10E6/uL (ref 4.14–5.80)
RDW: 13.9 % (ref 12.3–15.4)
WBC: 8 10*3/uL (ref 3.4–10.8)

## 2016-03-31 LAB — COMPREHENSIVE METABOLIC PANEL
A/G RATIO: 2.6 — AB (ref 1.2–2.2)
ALBUMIN: 5 g/dL (ref 3.5–5.5)
ALT: 26 IU/L (ref 0–44)
AST: 29 IU/L (ref 0–40)
Alkaline Phosphatase: 93 IU/L (ref 39–117)
BUN / CREAT RATIO: 10 (ref 9–20)
BUN: 9 mg/dL (ref 6–20)
Bilirubin Total: 0.3 mg/dL (ref 0.0–1.2)
CO2: 27 mmol/L (ref 18–29)
Calcium: 9.6 mg/dL (ref 8.7–10.2)
Chloride: 97 mmol/L (ref 96–106)
Creatinine, Ser: 0.92 mg/dL (ref 0.76–1.27)
GFR calc Af Amer: 134 mL/min/{1.73_m2} (ref >59)
GFR, EST NON AFRICAN AMERICAN: 116 mL/min/{1.73_m2} (ref >59)
GLOBULIN, TOTAL: 1.9 g/dL (ref 1.5–4.5)
Glucose: 104 mg/dL — ABNORMAL HIGH (ref 65–99)
Potassium: 4.7 mmol/L (ref 3.5–5.2)
SODIUM: 140 mmol/L (ref 134–144)
Total Protein: 6.9 g/dL (ref 6.0–8.5)

## 2016-03-31 LAB — TSH: TSH: 1.54 u[IU]/mL (ref 0.450–4.500)

## 2016-09-21 ENCOUNTER — Ambulatory Visit: Payer: BLUE CROSS/BLUE SHIELD | Admitting: Family Medicine

## 2016-09-22 DIAGNOSIS — M25532 Pain in left wrist: Secondary | ICD-10-CM | POA: Diagnosis not present

## 2016-09-22 DIAGNOSIS — M79602 Pain in left arm: Secondary | ICD-10-CM | POA: Diagnosis not present

## 2016-09-22 DIAGNOSIS — M25522 Pain in left elbow: Secondary | ICD-10-CM | POA: Diagnosis not present

## 2016-09-22 DIAGNOSIS — M79601 Pain in right arm: Secondary | ICD-10-CM | POA: Diagnosis not present

## 2016-09-22 DIAGNOSIS — M25442 Effusion, left hand: Secondary | ICD-10-CM | POA: Diagnosis not present

## 2016-09-22 DIAGNOSIS — M5412 Radiculopathy, cervical region: Secondary | ICD-10-CM | POA: Diagnosis not present

## 2016-10-12 ENCOUNTER — Ambulatory Visit: Payer: BLUE CROSS/BLUE SHIELD | Admitting: Family Medicine

## 2017-03-09 DIAGNOSIS — F331 Major depressive disorder, recurrent, moderate: Secondary | ICD-10-CM | POA: Diagnosis not present

## 2017-03-22 ENCOUNTER — Encounter: Payer: Self-pay | Admitting: Family Medicine

## 2017-03-31 ENCOUNTER — Encounter: Payer: BLUE CROSS/BLUE SHIELD | Admitting: Family Medicine

## 2017-04-03 ENCOUNTER — Other Ambulatory Visit: Payer: Self-pay | Admitting: Family Medicine

## 2017-04-03 DIAGNOSIS — F3342 Major depressive disorder, recurrent, in full remission: Secondary | ICD-10-CM

## 2017-04-04 ENCOUNTER — Ambulatory Visit (INDEPENDENT_AMBULATORY_CARE_PROVIDER_SITE_OTHER): Payer: BLUE CROSS/BLUE SHIELD | Admitting: Family Medicine

## 2017-04-04 ENCOUNTER — Encounter: Payer: Self-pay | Admitting: Family Medicine

## 2017-04-04 DIAGNOSIS — F3342 Major depressive disorder, recurrent, in full remission: Secondary | ICD-10-CM | POA: Diagnosis not present

## 2017-04-04 MED ORDER — TRAZODONE HCL 50 MG PO TABS: 25.0000 mg | ORAL_TABLET | Freq: Every evening | ORAL | 3 refills | Status: DC | PRN

## 2017-04-04 MED ORDER — CITALOPRAM HYDROBROMIDE 40 MG PO TABS: 40.0000 mg | ORAL_TABLET | Freq: Every day | ORAL | 12 refills | Status: DC

## 2017-04-04 NOTE — Progress Notes (Signed)
BP 101/63   Pulse 75   Temp 97.9 F (36.6 C)   Ht 5' 5.6" (1.666 m)   Wt 148 lb (67.1 kg)   SpO2 98%   BMI 24.18 kg/m    Subjective:    Patient ID: Connor Wilson, male    DOB: 1991-04-11, 26 y.o.   MRN: [id number]  HPI: Connor Wilson is a 26 y.o. male  Chief Complaint  Patient presents with  . Depression  She with tough times dealing with brother's suicide over the last month. Patient's been in rehabilitation over the last month also after some heavy alcohol use which is stopped. Patient's psychiatrist in the hospital tried to switch from citalopram to Prozac which went very poorly. Patient back on citalopram and doing okay except for just the stress of still dealing with the loss of his brother.  Relevant past medical, surgical, family and social history reviewed and updated as indicated. Interim medical history since our last visit reviewed. Allergies and medications reviewed and updated.  Review of Systems  Constitutional: Negative.   Respiratory: Negative.   Cardiovascular: Negative.     Per HPI unless specifically indicated above     Objective:    BP 101/63   Pulse 75   Temp 97.9 F (36.6 C)   Ht 5' 5.6" (1.666 m)   Wt 148 lb (67.1 kg)   SpO2 98%   BMI 24.18 kg/m   Wt Readings from Last 3 Encounters:  04/04/17 148 lb (67.1 kg)  03/30/16 145 lb (65.8 kg)  09/29/15 137 lb (62.1 kg)    Physical Exam  Constitutional: He is oriented to person, place, and time. He appears well-developed and well-nourished.  HENT:  Head: Normocephalic and atraumatic.  Eyes: Conjunctivae and EOM are normal.  Neck: Normal range of motion.  Cardiovascular: Normal rate, regular rhythm and normal heart sounds.   Pulmonary/Chest: Effort normal and breath sounds normal.  Musculoskeletal: Normal range of motion.  Neurological: He is alert and oriented to person, place, and time.  Skin: No erythema.  Psychiatric: He has a normal mood and affect. His behavior is normal.  Judgment and thought content normal.    Results for orders placed or performed in visit on 03/30/16  CBC with Differential/Platelet  Result Value Ref Range   WBC 8.0 3.4 - 10.8 x10E3/uL   RBC 5.69 4.14 - 5.80 x10E6/uL   Hemoglobin 14.9 12.6 - 17.7 g/dL   Hematocrit 44.2 37.5 - 51.0 %   MCV 78 (L) 79 - 97 fL   MCH 26.2 (L) 26.6 - 33.0 pg   MCHC 33.7 31.5 - 35.7 g/dL   RDW 13.9 12.3 - 15.4 %   Platelets 253 150 - 379 x10E3/uL   Neutrophils 54 Not Estab. %   Lymphs 36 Not Estab. %   Monocytes 8 Not Estab. %   Eos 2 Not Estab. %   Basos 0 Not Estab. %   Neutrophils Absolute 4.4 1.4 - 7.0 x10E3/uL   Lymphocytes Absolute 2.9 0.7 - 3.1 x10E3/uL   Monocytes Absolute 0.6 0.1 - 0.9 x10E3/uL   EOS (ABSOLUTE) 0.2 0.0 - 0.4 x10E3/uL   Basophils Absolute 0.0 0.0 - 0.2 x10E3/uL   Immature Granulocytes 0 Not Estab. %   Immature Grans (Abs) 0.0 0.0 - 0.1 x10E3/uL  Comprehensive metabolic panel  Result Value Ref Range   Glucose 104 (H) 65 - 99 mg/dL   BUN 9 6 - 20 mg/dL   Creatinine, Ser 0.92 0.76 - 1.27 mg/dL  GFR calc non Af Amer 116 >59 mL/min/1.73   GFR calc Af Amer 134 >59 mL/min/1.73   BUN/Creatinine Ratio 10 9 - 20   Sodium 140 134 - 144 mmol/L   Potassium 4.7 3.5 - 5.2 mmol/L   Chloride 97 96 - 106 mmol/L   CO2 27 18 - 29 mmol/L   Calcium 9.6 8.7 - 10.2 mg/dL   Total Protein 6.9 6.0 - 8.5 g/dL   Albumin 5.0 3.5 - 5.5 g/dL   Globulin, Total 1.9 1.5 - 4.5 g/dL   Albumin/Globulin Ratio 2.6 (H) 1.2 - 2.2   Bilirubin Total 0.3 0.0 - 1.2 mg/dL   Alkaline Phosphatase 93 39 - 117 IU/L   AST 29 0 - 40 IU/L   ALT 26 0 - 44 IU/L  Urinalysis, Routine w reflex microscopic (not at [facility])  Result Value Ref Range   Specific Gravity, UA 1.015 1.005 - 1.030   pH, UA 7.0 5.0 - 7.5   Color, UA Yellow Yellow   Appearance Ur Clear Clear   Leukocytes, UA Negative Negative   Protein, UA Negative Negative/Trace   Glucose, UA Negative Negative   Ketones, UA Negative Negative   RBC, UA Negative  Negative   Bilirubin, UA Negative Negative   Urobilinogen, Ur 0.2 0.2 - 1.0 mg/dL   Nitrite, UA Negative Negative  Lipid Profile  Result Value Ref Range   Cholesterol, Total 204 (H) 100 - 199 mg/dL   Triglycerides 278 (H) 0 - 149 mg/dL   HDL 54 >39 mg/dL   VLDL Cholesterol Cal 56 (H) 5 - 40 mg/dL   LDL Calculated 94 0 - 99 mg/dL   Chol/HDL Ratio 3.8 0.0 - 5.0 ratio units  TSH  Result Value Ref Range   TSH 1.540 0.450 - 4.500 uIU/mL      Assessment & Plan:   Problem List Items Addressed This Visit      Other   Depression   Relevant Medications   citalopram (CELEXA) 40 MG tablet   traZODone (DESYREL) 50 MG tablet       Follow up plan: Return in about 3 months (around 07/05/2017) for Physical Exam.

## 2017-04-04 NOTE — Telephone Encounter (Signed)
Pt needs an appointment before this prescription can be refilled.

## 2017-05-25 DIAGNOSIS — J019 Acute sinusitis, unspecified: Secondary | ICD-10-CM | POA: Diagnosis not present

## 2017-05-25 DIAGNOSIS — B9689 Other specified bacterial agents as the cause of diseases classified elsewhere: Secondary | ICD-10-CM | POA: Diagnosis not present

## 2017-06-09 ENCOUNTER — Encounter: Payer: Self-pay | Admitting: Family Medicine

## 2017-07-06 ENCOUNTER — Encounter: Payer: BLUE CROSS/BLUE SHIELD | Admitting: Family Medicine

## 2017-08-10 DIAGNOSIS — J029 Acute pharyngitis, unspecified: Secondary | ICD-10-CM | POA: Diagnosis not present

## 2017-08-10 DIAGNOSIS — J069 Acute upper respiratory infection, unspecified: Secondary | ICD-10-CM | POA: Diagnosis not present

## 2017-08-13 ENCOUNTER — Other Ambulatory Visit: Payer: Self-pay | Admitting: Family Medicine

## 2017-08-15 NOTE — Telephone Encounter (Signed)
LOV 06/04/17 with Dr. Jeananne Rama  appt on 07/06/17 cancelled  [place], [place]  [address]

## 2017-08-29 DIAGNOSIS — R42 Dizziness and giddiness: Secondary | ICD-10-CM | POA: Diagnosis not present

## 2017-08-29 DIAGNOSIS — J069 Acute upper respiratory infection, unspecified: Secondary | ICD-10-CM | POA: Diagnosis not present

## 2017-09-09 ENCOUNTER — Encounter: Payer: Self-pay | Admitting: Family Medicine

## 2017-09-09 ENCOUNTER — Ambulatory Visit (INDEPENDENT_AMBULATORY_CARE_PROVIDER_SITE_OTHER): Payer: BLUE CROSS/BLUE SHIELD | Admitting: Family Medicine

## 2017-09-09 VITALS — BP 104/65 | HR 84 | Temp 98.5°F | Wt 148.7 lb

## 2017-09-09 DIAGNOSIS — J029 Acute pharyngitis, unspecified: Secondary | ICD-10-CM | POA: Diagnosis not present

## 2017-09-09 MED ORDER — LIDOCAINE VISCOUS 2 % MT SOLN: 5.0000 mL | OROMUCOSAL | 0 refills | Status: DC | PRN

## 2017-09-09 MED ORDER — AMOXICILLIN 875 MG PO TABS: 875.0000 mg | ORAL_TABLET | Freq: Two times a day (BID) | ORAL | 0 refills | Status: DC

## 2017-09-09 MED ORDER — BENZONATATE 200 MG PO CAPS: 200.0000 mg | ORAL_CAPSULE | Freq: Two times a day (BID) | ORAL | 0 refills | Status: DC | PRN

## 2017-09-09 NOTE — Progress Notes (Signed)
   BP 104/65 (BP Location: Right Arm, Patient Position: Sitting, Cuff Size: Normal)   Pulse 84   Temp 98.5 F (36.9 C) (Oral)   Wt 148 lb 11.2 oz (67.4 kg)   SpO2 99%   BMI 24.29 kg/m    Subjective:    Patient ID: Connor Wilson, male    DOB: 11-24-1990, 27 y.o.   MRN: [id number]  HPI: Connor Wilson is a 27 y.o. male  Chief Complaint  Patient presents with  . Sinusitis    Patient thinks he has a sinus infection, also children have been sick with URI.  Marland Kitchen Fatigue  . Headache   Facial pain and pressure, migraine, sore throat, fatigue, sweats x 1 week. Sxs worsening the past 2 days. Denies Cp, SOB, significant cough, N/V/D. Taking vitamin C supplements and ibuprofen with no relief. Does have a hx of seasonal allergies, on zyrtec and flonase. Kids have been passing URIs back and forth, most recently strep pharyngitis. Former smoker.   Relevant past medical, surgical, family and social history reviewed and updated as indicated. Interim medical history since our last visit reviewed. Allergies and medications reviewed and updated.  Review of Systems  Per HPI unless specifically indicated above     Objective:    BP 104/65 (BP Location: Right Arm, Patient Position: Sitting, Cuff Size: Normal)   Pulse 84   Temp 98.5 F (36.9 C) (Oral)   Wt 148 lb 11.2 oz (67.4 kg)   SpO2 99%   BMI 24.29 kg/m   Wt Readings from Last 3 Encounters:  09/09/17 148 lb 11.2 oz (67.4 kg)  04/04/17 148 lb (67.1 kg)  03/30/16 145 lb (65.8 kg)    Physical Exam  Constitutional: He is oriented to person, place, and time. He appears well-developed and well-nourished. No distress.  HENT:  Head: Atraumatic.  Right Ear: External ear normal.  Left Ear: External ear normal.  Nose: Nose normal.  Oropharynx significantly erythematous and edematous, minimal exudates noted  Eyes: Pupils are equal, round, and reactive to light. Conjunctivae are normal.  Neck: Normal range of motion. Neck supple.    Cardiovascular: Normal rate and normal heart sounds.  Pulmonary/Chest: Effort normal and breath sounds normal. No respiratory distress.  Musculoskeletal: Normal range of motion.  Neurological: He is alert and oriented to person, place, and time.  Skin: Skin is warm and dry.  Psychiatric: He has a normal mood and affect. His behavior is normal.  Nursing note and vitals reviewed.   Results for orders placed or performed in visit on 09/09/17  Rapid Strep Screen (Not at [facility])  Result Value Ref Range   Strep Gp A Ag, IA W/Reflex Negative Negative  Culture, Group A Strep  Result Value Ref Range   Strep A Culture Comment       Assessment & Plan:   Problem List Items Addressed This Visit    None    Visit Diagnoses    Pharyngitis, unspecified etiology    -  Primary   Rapid strep -, await cx. Given exposures and pharyngeal exam findings will initiate amoxil and viscous lidocaine as well as supportive care.    Relevant Orders   Rapid Strep Screen (Not at [facility]) (Completed)       Follow up plan: Return if symptoms worsen or fail to improve.

## 2017-09-12 LAB — RAPID STREP SCREEN (MED CTR MEBANE ONLY): STREP GP A AG, IA W/REFLEX: NEGATIVE

## 2017-09-12 LAB — CULTURE, GROUP A STREP: STREP A CULTURE: NEGATIVE

## 2017-09-12 NOTE — Patient Instructions (Signed)
Follow up as needed

## 2017-09-14 ENCOUNTER — Ambulatory Visit (INDEPENDENT_AMBULATORY_CARE_PROVIDER_SITE_OTHER): Payer: BLUE CROSS/BLUE SHIELD | Admitting: Family Medicine

## 2017-09-14 ENCOUNTER — Encounter: Payer: Self-pay | Admitting: Family Medicine

## 2017-09-14 VITALS — BP 112/68 | HR 70 | Ht 66.0 in | Wt 150.0 lb

## 2017-09-14 DIAGNOSIS — Z125 Encounter for screening for malignant neoplasm of prostate: Secondary | ICD-10-CM

## 2017-09-14 DIAGNOSIS — Z1329 Encounter for screening for other suspected endocrine disorder: Secondary | ICD-10-CM | POA: Diagnosis not present

## 2017-09-14 DIAGNOSIS — Z0000 Encounter for general adult medical examination without abnormal findings: Secondary | ICD-10-CM

## 2017-09-14 DIAGNOSIS — Z131 Encounter for screening for diabetes mellitus: Secondary | ICD-10-CM | POA: Diagnosis not present

## 2017-09-14 DIAGNOSIS — F332 Major depressive disorder, recurrent severe without psychotic features: Secondary | ICD-10-CM | POA: Diagnosis not present

## 2017-09-14 DIAGNOSIS — Z13220 Encounter for screening for lipoid disorders: Secondary | ICD-10-CM

## 2017-09-14 MED ORDER — ARIPIPRAZOLE 5 MG PO TABS: 5.0000 mg | ORAL_TABLET | Freq: Every day | ORAL | 1 refills | Status: DC

## 2017-09-14 NOTE — Assessment & Plan Note (Signed)
Discussed depression care and treatment suicidal ideation which is non-may be some thoughts.  No specific plans.  Will add Abilify 5 mg recheck 2 weeks. Discussed may need to do psychiatry referral

## 2017-09-14 NOTE — Addendum Note (Signed)
Addended by: Golden Pop A on: 09/14/2017 08:55 AM   Modules accepted: Orders

## 2017-09-14 NOTE — Progress Notes (Signed)
BP 112/68   Pulse 70   Ht 5\' 6"  (1.676 m)   Wt 150 lb (68 kg)   SpO2 98%   BMI 24.21 kg/m    Subjective:    Patient ID: Connor Wilson, male    DOB: Mar 20, 1991, 27 y.o.   MRN: [id number]  HPI: [name] is a 27 y.o. male  Chief Complaint  Patient presents with  . Follow-up  . Medication Problem    Pt would like to discuss Celexa  . Depression    Pt completing PHQ-9  . Sore Throat    Pt was seen on 09/09/17 for sore throat, been on antibiotic, symptoms continue  Patient follow-up depression score of 20 on PHQ 9. States really having a tough time with sore throat slowly getting better sick kids at home with a lot of stress from work continues to take citalopram without problems.   Relevant past medical, surgical, family and social history reviewed and updated as indicated. Interim medical history since our last visit reviewed. Allergies and medications reviewed and updated.  Review of Systems  Constitutional: Negative.   Respiratory: Negative.   Cardiovascular: Negative.     Per HPI unless specifically indicated above     Objective:    BP 112/68   Pulse 70   Ht 5\' 6"  (1.676 m)   Wt 150 lb (68 kg)   SpO2 98%   BMI 24.21 kg/m   Wt Readings from Last 3 Encounters:  09/14/17 150 lb (68 kg)  09/09/17 148 lb 11.2 oz (67.4 kg)  04/04/17 148 lb (67.1 kg)    Physical Exam  Constitutional: He is oriented to person, place, and time. He appears well-developed and well-nourished. No distress.  HENT:  Head: Normocephalic and atraumatic.  Right Ear: Hearing normal.  Left Ear: Hearing normal.  Nose: Nose normal.  Eyes: Conjunctivae and lids are normal. Right eye exhibits no discharge. Left eye exhibits no discharge. No scleral icterus.  Pulmonary/Chest: Effort normal. No respiratory distress.  Musculoskeletal: Normal range of motion.  Neurological: He is alert and oriented to person, place, and time.  Skin: Skin is intact. No rash noted.  Psychiatric: He has a  normal mood and affect. His speech is normal and behavior is normal. Judgment and thought content normal. Cognition and memory are normal.    Results for orders placed or performed in visit on 09/09/17  Rapid Strep Screen (Not at [facility])  Result Value Ref Range   Strep Gp A Ag, IA W/Reflex Negative Negative  Culture, Group A Strep  Result Value Ref Range   Strep A Culture Negative       Assessment & Plan:   Problem List Items Addressed This Visit      Other   Depression    Discussed depression care and treatment suicidal ideation which is non-may be some thoughts.  No specific plans.  Will add Abilify 5 mg recheck 2 weeks. Discussed may need to do psychiatry referral       Other Visit Diagnoses    Routine general medical examination at a health care facility    -  Primary   Relevant Orders   CBC with Differential/Platelet   Comprehensive metabolic panel   Lipid panel   PSA   TSH   Urinalysis, Routine w reflex microscopic   Screening cholesterol level       Relevant Orders   Lipid panel   Screening for diabetes mellitus (DM)       Relevant  Orders   CBC with Differential/Platelet   Comprehensive metabolic panel   Urinalysis, Routine w reflex microscopic   Thyroid disorder screen       Relevant Orders   TSH   Prostate cancer screening       Relevant Orders   PSA    Discussed pharyngitis throat appears normal at this point will observe and finish antibiotics.   Follow up plan: Return in about 2 weeks (around 09/28/2017).

## 2017-09-17 ENCOUNTER — Other Ambulatory Visit: Payer: Self-pay | Admitting: Family Medicine

## 2017-09-26 ENCOUNTER — Ambulatory Visit: Payer: BLUE CROSS/BLUE SHIELD | Admitting: Family Medicine

## 2017-10-31 ENCOUNTER — Emergency Department: Admission: EM | Admit: 2017-10-31 | Discharge: 2017-10-31 | Discharge status: Absent without leave | Payer: Self-pay

## 2017-10-31 NOTE — ED Notes (Signed)
No answer when called several times from lobby 

## 2017-11-01 DIAGNOSIS — N390 Urinary tract infection, site not specified: Secondary | ICD-10-CM | POA: Diagnosis not present

## 2017-11-01 DIAGNOSIS — R531 Weakness: Secondary | ICD-10-CM | POA: Diagnosis not present

## 2018-02-08 DIAGNOSIS — R454 Irritability and anger: Secondary | ICD-10-CM | POA: Diagnosis not present

## 2018-02-08 DIAGNOSIS — Z6824 Body mass index (BMI) 24.0-24.9, adult: Secondary | ICD-10-CM | POA: Diagnosis not present

## 2018-02-08 DIAGNOSIS — F39 Unspecified mood [affective] disorder: Secondary | ICD-10-CM | POA: Diagnosis not present

## 2018-02-09 DIAGNOSIS — F419 Anxiety disorder, unspecified: Secondary | ICD-10-CM | POA: Diagnosis not present

## 2018-02-09 DIAGNOSIS — R4589 Other symptoms and signs involving emotional state: Secondary | ICD-10-CM | POA: Diagnosis not present

## 2018-02-09 DIAGNOSIS — Z8659 Personal history of other mental and behavioral disorders: Secondary | ICD-10-CM | POA: Diagnosis not present

## 2018-02-09 DIAGNOSIS — F329 Major depressive disorder, single episode, unspecified: Secondary | ICD-10-CM | POA: Diagnosis not present

## 2018-02-10 DIAGNOSIS — Z8659 Personal history of other mental and behavioral disorders: Secondary | ICD-10-CM | POA: Diagnosis not present

## 2018-02-10 DIAGNOSIS — F419 Anxiety disorder, unspecified: Secondary | ICD-10-CM | POA: Diagnosis not present

## 2018-02-10 DIAGNOSIS — R4589 Other symptoms and signs involving emotional state: Secondary | ICD-10-CM | POA: Diagnosis not present

## 2018-02-10 DIAGNOSIS — F329 Major depressive disorder, single episode, unspecified: Secondary | ICD-10-CM | POA: Diagnosis not present

## 2018-02-11 DIAGNOSIS — F419 Anxiety disorder, unspecified: Secondary | ICD-10-CM | POA: Diagnosis not present

## 2018-02-11 DIAGNOSIS — F329 Major depressive disorder, single episode, unspecified: Secondary | ICD-10-CM | POA: Diagnosis not present

## 2018-02-11 DIAGNOSIS — Z8659 Personal history of other mental and behavioral disorders: Secondary | ICD-10-CM | POA: Diagnosis not present

## 2018-02-11 DIAGNOSIS — R4589 Other symptoms and signs involving emotional state: Secondary | ICD-10-CM | POA: Diagnosis not present

## 2018-02-12 DIAGNOSIS — F329 Major depressive disorder, single episode, unspecified: Secondary | ICD-10-CM | POA: Diagnosis not present

## 2018-02-12 DIAGNOSIS — F419 Anxiety disorder, unspecified: Secondary | ICD-10-CM | POA: Diagnosis not present

## 2018-02-12 DIAGNOSIS — R4589 Other symptoms and signs involving emotional state: Secondary | ICD-10-CM | POA: Diagnosis not present

## 2018-02-12 DIAGNOSIS — Z8659 Personal history of other mental and behavioral disorders: Secondary | ICD-10-CM | POA: Diagnosis not present

## 2018-02-13 DIAGNOSIS — F332 Major depressive disorder, recurrent severe without psychotic features: Secondary | ICD-10-CM | POA: Diagnosis not present

## 2018-02-13 DIAGNOSIS — F419 Anxiety disorder, unspecified: Secondary | ICD-10-CM | POA: Diagnosis not present

## 2018-03-01 DIAGNOSIS — F3132 Bipolar disorder, current episode depressed, moderate: Secondary | ICD-10-CM | POA: Diagnosis not present

## 2018-04-15 ENCOUNTER — Other Ambulatory Visit: Payer: Self-pay | Admitting: Family Medicine

## 2018-04-15 DIAGNOSIS — F3342 Major depressive disorder, recurrent, in full remission: Secondary | ICD-10-CM

## 2018-04-17 NOTE — Telephone Encounter (Signed)
Requested medication (s) are due for refill today: yes  Requested medication (s) are on the active medication list: yes  Last refill:  04/04/17   30 tabs with 12 refills  Future visit scheduled: no  Notes to clinic:  [place]  09/14/17. This prescription expired on 04/04/18.  No upcoming visit scheduled.  Requested Prescriptions  Pending Prescriptions Disp Refills   citalopram (CELEXA) 40 MG tablet [Pharmacy Med Name: CITALOPRAM 40MG  TABLETS] 30 tablet 0    Sig: TAKE 1 TABLET(40 MG) BY MOUTH DAILY     Psychiatry:  Antidepressants - SSRI Failed - 04/15/2018  3:19 AM      Failed - Valid encounter within last 6 months    Recent Outpatient Visits          7 months ago Routine general medical examination at a health care facility   [facility] Jeananne Rama, Jeannette How, [place]   7 months ago Pharyngitis, unspecified etiology   [facility] Vol[place] American, [place]   1 year ago Recurrent major depressive disorder, in full remission [facility])   [facility] [place], Jeannette How, MD   2 years ago Routine general medical examination at a health care facility   [place], Jeannette How, MD   2 years ago Depression   [place], [place]             Passed - Completed PHQ-2 or PHQ-9 in the last 360 days.

## 2018-05-09 ENCOUNTER — Encounter: Payer: Self-pay | Admitting: Family Medicine

## 2018-05-09 ENCOUNTER — Ambulatory Visit (INDEPENDENT_AMBULATORY_CARE_PROVIDER_SITE_OTHER): Payer: BLUE CROSS/BLUE SHIELD | Admitting: Family Medicine

## 2018-05-09 DIAGNOSIS — F3342 Major depressive disorder, recurrent, in full remission: Secondary | ICD-10-CM

## 2018-05-09 DIAGNOSIS — F1021 Alcohol dependence, in remission: Secondary | ICD-10-CM | POA: Insufficient documentation

## 2018-05-09 MED ORDER — CITALOPRAM HYDROBROMIDE 40 MG PO TABS: 40.0000 mg | ORAL_TABLET | Freq: Every day | ORAL | 2 refills | Status: DC

## 2018-05-09 NOTE — Progress Notes (Signed)
   BP 136/68   Pulse 84   Temp 98.1 F (36.7 C) (Oral)   Wt 148 lb 12.8 oz (67.5 kg)   SpO2 96%   BMI 24.02 kg/m    Subjective:    Patient ID: Connor Wilson, male    DOB: Jun 23, 1990, 27 y.o.   MRN: [id number]  HPI: Connor Wilson is a 27 y.o. male  Chief Complaint  Patient presents with  . Depression  . Labs Only    pt requesting bloodwork for thyroid, cholesterol, routine labs, etc  Patient follow-up all in all doing really well has been sober and drug and alcohol free for over 2 months now.  Patient still with a lot of stress with relationship with his common-law wife. Reviewed medications only taking citalopram 40 mg. Reviewed labs done through [facility] which were normal back in April.  Relevant past medical, surgical, family and social history reviewed and updated as indicated. Interim medical history since our last visit reviewed. Allergies and medications reviewed and updated.  Review of Systems  Constitutional: Negative.   Respiratory: Negative.   Cardiovascular: Negative.     Per HPI unless specifically indicated above     Objective:    BP 136/68   Pulse 84   Temp 98.1 F (36.7 C) (Oral)   Wt 148 lb 12.8 oz (67.5 kg)   SpO2 96%   BMI 24.02 kg/m   Wt Readings from Last 3 Encounters:  05/09/18 148 lb 12.8 oz (67.5 kg)  09/14/17 150 lb (68 kg)  09/09/17 148 lb 11.2 oz (67.4 kg)    Physical Exam  Constitutional: He is oriented to person, place, and time. He appears well-developed and well-nourished.  HENT:  Head: Normocephalic and atraumatic.  Eyes: Conjunctivae and EOM are normal.  Neck: Normal range of motion.  Cardiovascular: Normal rate, regular rhythm and normal heart sounds.  Pulmonary/Chest: Effort normal and breath sounds normal.  Musculoskeletal: Normal range of motion.  Neurological: He is alert and oriented to person, place, and time.  Skin: No erythema.  Psychiatric: He has a normal mood and affect. His behavior is normal. Judgment and  thought content normal.    Results for orders placed or performed in visit on 09/09/17  Rapid Strep Screen (Not at [facility])  Result Value Ref Range   Strep Gp A Ag, IA W/Reflex Negative Negative  Culture, Group A Strep  Result Value Ref Range   Strep A Culture Negative       Assessment & Plan:   Problem List Items Addressed This Visit      Other   Depression    Stable on current medications      Relevant Medications   citalopram (CELEXA) 40 MG tablet   Personal history of alcoholism ([place])    Alcohol free for almost 2 months now          Follow up plan: Return in about 6 months (around 11/08/2018) for Physical Exam.

## 2018-05-09 NOTE — Assessment & Plan Note (Signed)
Alcohol free for almost 2 months now

## 2018-05-09 NOTE — Assessment & Plan Note (Signed)
Stable on current medications 

## 2018-06-08 DIAGNOSIS — M545 Low back pain: Secondary | ICD-10-CM | POA: Diagnosis not present

## 2018-07-05 DIAGNOSIS — M5441 Lumbago with sciatica, right side: Secondary | ICD-10-CM | POA: Diagnosis not present

## 2018-07-05 DIAGNOSIS — K5900 Constipation, unspecified: Secondary | ICD-10-CM | POA: Diagnosis not present

## 2018-07-05 DIAGNOSIS — M545 Low back pain: Secondary | ICD-10-CM | POA: Diagnosis not present

## 2018-09-14 ENCOUNTER — Ambulatory Visit (INDEPENDENT_AMBULATORY_CARE_PROVIDER_SITE_OTHER): Payer: BLUE CROSS/BLUE SHIELD | Admitting: Family Medicine

## 2018-09-14 ENCOUNTER — Ambulatory Visit: Payer: Self-pay | Admitting: *Deleted

## 2018-09-14 ENCOUNTER — Encounter: Payer: Self-pay | Admitting: Family Medicine

## 2018-09-14 ENCOUNTER — Other Ambulatory Visit: Payer: Self-pay

## 2018-09-14 VITALS — BP 122/56 | HR 80 | Temp 98.4°F | Ht 66.0 in | Wt 148.0 lb

## 2018-09-14 DIAGNOSIS — R42 Dizziness and giddiness: Secondary | ICD-10-CM | POA: Diagnosis not present

## 2018-09-14 MED ORDER — MECLIZINE HCL 25 MG PO TABS: 25.0000 mg | ORAL_TABLET | Freq: Three times a day (TID) | ORAL | 0 refills | Status: DC | PRN

## 2018-09-14 NOTE — Telephone Encounter (Addendum)
Began experiencing dizziness yesterday while up and moving around.Denies vision changes/weakness/CP/SOB/vertigo. Feels like his blood pressure may be high he thinks. Stated his veins seem to be more pronounced at this time. No fever/abdomianl pain/N/V/diarrhea.woke up and began feeling lightheaded again this morning. It continues now.No hx of hypertension. Stated he increased his fluids yesterday but didn't improve how he was feeling. Headache this morning that resolved on its own. Reviewed care advice including more fluids and lying down with legs elevated to increase blood flow.Stated he understood.Call and routing to PCP for possible appointment per disposition. No travels/no known exposures.  Reason for Disposition . [1] MODERATE dizziness (e.g., interferes with normal activities) AND [2] has NOT been evaluated by physician for this  (Exception: dizziness caused by heat exposure, sudden standing, or poor fluid intake)  Answer Assessment - Initial Assessment Questions 1. DESCRIPTION: "Describe your dizziness."     Lightheaded while moving around yesterday 2. LIGHTHEADED: "Do you feel lightheaded?" (e.g., somewhat faint, woozy, weak upon standing)     woozy 3. VERTIGO: "Do you feel like either you or the room is spinning or tilting?" (i.e. vertigo)     no 4. SEVERITY: "How bad is it?"  "Do you feel like you are going to faint?" "Can you stand and walk?"   - MILD - walking normally   - MODERATE - interferes with normal activities (e.g., work, school)    - SEVERE - unable to stand, requires support to walk, feels like passing out now.     mild 5. ONSET:  "When did the dizziness begin?"     yesterday 6. AGGRAVATING FACTORS: "Does anything make it worse?" (e.g., standing, change in head position)    Moving arund 7. HEART RATE: "Can you tell me your heart rate?" "How many beats in 15 secs?"  (Note: not all patients can do this)      no 8. CAUSE: "What do you think is causing the dizziness?"  Thinks his pressure is high from stress 9. RECURRENT SYMPTOM: "Have you had dizziness before?" If so, ask: "When was the last time?" "What happened that time?"     no 10. OTHER SYMPTOMS: "Do you have any other symptoms?" (e.g., fever, chest pain, vomiting, diarrhea, bleeding)       none 11. PREGNANCY: "Is there any chance you are pregnant?" "When was your last menstrual period?"       na  Protocols used: DIZZINESS [facility]

## 2018-09-14 NOTE — Telephone Encounter (Signed)
This patient needs to be worked in today with 1 of the providers in the office.

## 2018-09-14 NOTE — Telephone Encounter (Signed)
Scheduled per Dr Jeananne Rama

## 2018-09-14 NOTE — Progress Notes (Signed)
BP (!) 122/56   Pulse 80   Temp 98.4 F (36.9 C) (Oral)   Ht 5\' 6"  (1.676 m)   Wt 148 lb (67.1 kg)   SpO2 98%   BMI 23.89 kg/m    Subjective:    Patient ID: Connor Wilson, male    DOB: November 15, 1990, 28 y.o.   MRN: [id number]  HPI: Connor Wilson is a 28 y.o. male  Chief Complaint  Patient presents with  . Dizziness    x 2 days   Here today for 2 days of dizziness that comes and goes throughout the day. Notes it's worse when he's up and moving around. Some room spinning sensation, but mostly just feels off-balance. No visual changes, headaches, CP, palpitations, N/V, falls, weakness, fevers, recent illness, ear pain, hearing changes. Has not tried anything for sxs. Pt with hx of drug and alcohol abuse but reports being clean from drugs x 6 months and minimal alcohol use lately (most recently 4 beers the day before this all started). Has been eating regular meals and staying well hydrated.   Relevant past medical, surgical, family and social history reviewed and updated as indicated. Interim medical history since our last visit reviewed. Allergies and medications reviewed and updated.  Review of Systems  Per HPI unless specifically indicated above     Objective:    BP (!) 122/56   Pulse 80   Temp 98.4 F (36.9 C) (Oral)   Ht 5\' 6"  (1.676 m)   Wt 148 lb (67.1 kg)   SpO2 98%   BMI 23.89 kg/m   Wt Readings from Last 3 Encounters:  09/14/18 148 lb (67.1 kg)  05/09/18 148 lb 12.8 oz (67.5 kg)  09/14/17 150 lb (68 kg)    Physical Exam Vitals signs and nursing note reviewed.  Constitutional:      Appearance: Normal appearance.  HENT:     Head: Atraumatic.  Eyes:     Extraocular Movements: Extraocular movements intact.     Conjunctiva/sclera: Conjunctivae normal.  Neck:     Musculoskeletal: Normal range of motion and neck supple.  Cardiovascular:     Rate and Rhythm: Normal rate and regular rhythm.  Pulmonary:     Effort: Pulmonary effort is normal.     Breath  sounds: Normal breath sounds.  Musculoskeletal: Normal range of motion.  Skin:    General: Skin is warm and dry.  Neurological:     General: No focal deficit present.     Mental Status: He is oriented to person, place, and time.     Motor: No weakness.     Coordination: Coordination normal.     Gait: Gait normal.  Psychiatric:        Mood and Affect: Mood normal.        Thought Content: Thought content normal.        Judgment: Judgment normal.     Results for orders placed or performed in visit on 09/14/18  TSH  Result Value Ref Range   TSH 0.759 0.450 - 4.500 uIU/mL  CBC with Differential/Platelet  Result Value Ref Range   WBC 6.3 3.4 - 10.8 x10E3/uL   RBC 5.78 4.14 - 5.80 x10E6/uL   Hemoglobin 15.0 13.0 - 17.7 g/dL   Hematocrit 45.8 37.5 - 51.0 %   MCV 79 79 - 97 fL   MCH 26.0 (L) 26.6 - 33.0 pg   MCHC 32.8 31.5 - 35.7 g/dL   RDW 13.8 11.6 - 15.4 %  Platelets 272 150 - 450 x10E3/uL   Neutrophils 53 Not Estab. %   Lymphs 34 Not Estab. %   Monocytes 9 Not Estab. %   Eos 3 Not Estab. %   Basos 1 Not Estab. %   Neutrophils Absolute 3.4 1.4 - 7.0 x10E3/uL   Lymphocytes Absolute 2.1 0.7 - 3.1 x10E3/uL   Monocytes Absolute 0.6 0.1 - 0.9 x10E3/uL   EOS (ABSOLUTE) 0.2 0.0 - 0.4 x10E3/uL   Basophils Absolute 0.1 0.0 - 0.2 x10E3/uL   Immature Granulocytes 0 Not Estab. %   Immature Grans (Abs) 0.0 0.0 - 0.1 [id number]  Basic Metabolic Panel (BMET)  Result Value Ref Range   Glucose 76 65 - 99 mg/dL   BUN 11 6 - 20 mg/dL   Creatinine, Ser 0.87 0.76 - 1.27 mg/dL   GFR calc non Af Amer 118 >59 mL/min/1.73   GFR calc Af Amer 137 >59 mL/min/1.73   BUN/Creatinine Ratio 13 9 - 20   Sodium 140 134 - 144 mmol/L   Potassium 4.5 3.5 - 5.2 mmol/L   Chloride 100 96 - 106 mmol/L   CO2 24 20 - 29 mmol/L   Calcium 9.3 8.7 - 10.2 mg/dL      Assessment & Plan:   Problem List Items Addressed This Visit    None    Visit Diagnoses    Dizziness    -  Primary   Relevant Orders   TSH  (Completed)   CBC with Differential/Platelet (Completed)   Basic Metabolic Panel (BMET) (Completed)     Orthostatics, vitals, and exam benign today, suspect mild vertigo. Continue good hydration, take meclizine prn, and rest. Epley maneuvers reviewed if not improving over next few days. Will run some basic labs additionally as he's not had labs in the last few years to r/o anemia, thyroid abnormalities, etc. Return precautions reviewed at length  Follow up plan: Return if symptoms worsen or fail to improve.

## 2018-09-15 ENCOUNTER — Encounter: Payer: Self-pay | Admitting: Family Medicine

## 2018-09-15 LAB — CBC WITH DIFFERENTIAL/PLATELET
Basophils Absolute: 0.1 10*3/uL (ref 0.0–0.2)
Basos: 1 %
EOS (ABSOLUTE): 0.2 10*3/uL (ref 0.0–0.4)
Eos: 3 %
Hematocrit: 45.8 % (ref 37.5–51.0)
Hemoglobin: 15 g/dL (ref 13.0–17.7)
Immature Grans (Abs): 0 10*3/uL (ref 0.0–0.1)
Immature Granulocytes: 0 %
Lymphocytes Absolute: 2.1 10*3/uL (ref 0.7–3.1)
Lymphs: 34 %
MCH: 26 pg — ABNORMAL LOW (ref 26.6–33.0)
MCHC: 32.8 g/dL (ref 31.5–35.7)
MCV: 79 fL (ref 79–97)
Monocytes Absolute: 0.6 10*3/uL (ref 0.1–0.9)
Monocytes: 9 %
Neutrophils Absolute: 3.4 10*3/uL (ref 1.4–7.0)
Neutrophils: 53 %
Platelets: 272 10*3/uL (ref 150–450)
RBC: 5.78 x10E6/uL (ref 4.14–5.80)
RDW: 13.8 % (ref 11.6–15.4)
WBC: 6.3 10*3/uL (ref 3.4–10.8)

## 2018-09-15 LAB — BASIC METABOLIC PANEL
BUN/Creatinine Ratio: 13 (ref 9–20)
BUN: 11 mg/dL (ref 6–20)
CO2: 24 mmol/L (ref 20–29)
Calcium: 9.3 mg/dL (ref 8.7–10.2)
Chloride: 100 mmol/L (ref 96–106)
Creatinine, Ser: 0.87 mg/dL (ref 0.76–1.27)
GFR calc Af Amer: 137 mL/min/{1.73_m2} (ref >59)
GFR calc non Af Amer: 118 mL/min/{1.73_m2} (ref >59)
Glucose: 76 mg/dL (ref 65–99)
Potassium: 4.5 mmol/L (ref 3.5–5.2)
Sodium: 140 mmol/L (ref 134–144)

## 2018-09-15 LAB — TSH: TSH: 0.759 u[IU]/mL (ref 0.450–4.500)

## 2018-11-09 ENCOUNTER — Other Ambulatory Visit: Payer: Self-pay

## 2018-11-09 ENCOUNTER — Encounter: Payer: Self-pay | Admitting: Family Medicine

## 2018-11-09 ENCOUNTER — Ambulatory Visit (INDEPENDENT_AMBULATORY_CARE_PROVIDER_SITE_OTHER): Payer: BC Managed Care – PPO | Admitting: Family Medicine

## 2018-11-09 DIAGNOSIS — F1021 Alcohol dependence, in remission: Secondary | ICD-10-CM

## 2018-11-09 DIAGNOSIS — F339 Major depressive disorder, recurrent, unspecified: Secondary | ICD-10-CM

## 2018-11-09 MED ORDER — ARIPIPRAZOLE 5 MG PO TABS: 5.0000 mg | ORAL_TABLET | Freq: Every day | ORAL | 1 refills | Status: DC

## 2018-11-09 NOTE — Progress Notes (Signed)
There were no vitals taken for this visit.   Subjective:    Patient ID: Connor Wilson, male    DOB: 30-Apr-1991, 28 y.o.   MRN: [id number]  HPI: Connor Wilson is a 28 y.o. male  Med check   Telemedicine using audio/video telecommunications for a synchronous communication visit. Today's visit due to COVID-19 isolation precautions I connected with and verified that I am speaking with the correct person using two identifiers.   I discussed the limitations, risks, security and privacy concerns of performing an evaluation and management service by telecommunication and the availability of in person appointments. I also discussed with the patient that there may be a patient responsible charge related to this service. The patient expressed understanding and agreed to proceed. The patient's location is home. I am at home.  Discussed with patient nerves getting worse has treatment and blood work last month with no real relief. Patient does drink about every other day up to 3 drinks. Takes citalopram without problems.   Relevant past medical, surgical, family and social history reviewed and updated as indicated. Interim medical history since our last visit reviewed. Allergies and medications reviewed and updated.  Review of Systems  Constitutional: Negative.   Respiratory: Negative.   Cardiovascular: Negative.     Per HPI unless specifically indicated above     Objective:    There were no vitals taken for this visit.  Wt Readings from Last 3 Encounters:  09/14/18 148 lb (67.1 kg)  05/09/18 148 lb 12.8 oz (67.5 kg)  09/14/17 150 lb (68 kg)    Physical Exam  Results for orders placed or performed in visit on 09/14/18  TSH  Result Value Ref Range   TSH 0.759 0.450 - 4.500 uIU/mL  CBC with Differential/Platelet  Result Value Ref Range   WBC 6.3 3.4 - 10.8 x10E3/uL   RBC 5.78 4.14 - 5.80 x10E6/uL   Hemoglobin 15.0 13.0 - 17.7 g/dL   Hematocrit 45.8 37.5 - 51.0 %   MCV  79 79 - 97 fL   MCH 26.0 (L) 26.6 - 33.0 pg   MCHC 32.8 31.5 - 35.7 g/dL   RDW 13.8 11.6 - 15.4 %   Platelets 272 150 - 450 x10E3/uL   Neutrophils 53 Not Estab. %   Lymphs 34 Not Estab. %   Monocytes 9 Not Estab. %   Eos 3 Not Estab. %   Basos 1 Not Estab. %   Neutrophils Absolute 3.4 1.4 - 7.0 x10E3/uL   Lymphocytes Absolute 2.1 0.7 - 3.1 x10E3/uL   Monocytes Absolute 0.6 0.1 - 0.9 x10E3/uL   EOS (ABSOLUTE) 0.2 0.0 - 0.4 x10E3/uL   Basophils Absolute 0.1 0.0 - 0.2 x10E3/uL   Immature Granulocytes 0 Not Estab. %   Immature Grans (Abs) 0.0 0.0 - 0.1 O67T2/WP  Basic Metabolic Panel (BMET)  Result Value Ref Range   Glucose 76 65 - 99 mg/dL   BUN 11 6 - 20 mg/dL   Creatinine, Ser 0.87 0.76 - 1.27 mg/dL   GFR calc non Af Amer 118 >59 mL/min/1.73   GFR calc Af Amer 137 >59 mL/min/1.73   BUN/Creatinine Ratio 13 9 - 20   Sodium 140 134 - 144 mmol/L   Potassium 4.5 3.5 - 5.2 mmol/L   Chloride 100 96 - 106 mmol/L   CO2 24 20 - 29 mmol/L   Calcium 9.3 8.7 - 10.2 mg/dL      Assessment & Plan:   Problem List Items Addressed This  Visit      Other   Personal history of alcoholism ([place])    Discussed stop drinking       Depression, recurrent ([place])    Discussed depression nerves will add Abilify 5 mg recheck 2 weeks         I discussed the assessment and treatment plan with the patient. The patient was provided an opportunity to ask questions and all were answered. The patient agreed with the plan and demonstrated an understanding of the instructions.   The patient was advised to call back or seek an in-person evaluation if the symptoms worsen or if the condition fails to improve as anticipated.   I provided 21+ minutes of time during this encounter. Follow up plan: Return in about 2 weeks (around 11/23/2018).

## 2018-11-09 NOTE — Assessment & Plan Note (Signed)
Discussed stop drinking

## 2018-11-09 NOTE — Assessment & Plan Note (Signed)
Discussed depression nerves will add Abilify 5 mg recheck 2 weeks

## 2018-11-27 ENCOUNTER — Other Ambulatory Visit: Payer: Self-pay | Admitting: Family Medicine

## 2018-12-04 ENCOUNTER — Other Ambulatory Visit: Payer: Self-pay

## 2018-12-04 ENCOUNTER — Encounter: Payer: Self-pay | Admitting: Family Medicine

## 2018-12-04 ENCOUNTER — Ambulatory Visit (INDEPENDENT_AMBULATORY_CARE_PROVIDER_SITE_OTHER): Payer: BC Managed Care – PPO | Admitting: Family Medicine

## 2018-12-04 DIAGNOSIS — F339 Major depressive disorder, recurrent, unspecified: Secondary | ICD-10-CM

## 2018-12-04 NOTE — Assessment & Plan Note (Signed)
Discussed recurrent depression and doing better now off medications will stay off medications for now.

## 2018-12-04 NOTE — Progress Notes (Signed)
There were no vitals taken for this visit.   Subjective:    Patient ID: Connor Wilson, male    DOB: 1990/08/25, 28 y.o.   MRN: [id number]  HPI: Connor Wilson is a 28 y.o. male  Med check  Discussed with patient unable to take Abilify had marked reactions and drowsiness associated. Patient did give up alcohol completely and is come to the conclusion that he can even drink a little bit. Patient states he is actually doing better and his nerves have improved.   Relevant past medical, surgical, family and social history reviewed and updated as indicated. Interim medical history since our last visit reviewed. Allergies and medications reviewed and updated.  Review of Systems  Constitutional: Negative.   Respiratory: Negative.   Cardiovascular: Negative.     Per HPI unless specifically indicated above     Objective:    There were no vitals taken for this visit.  Wt Readings from Last 3 Encounters:  09/14/18 148 lb (67.1 kg)  05/09/18 148 lb 12.8 oz (67.5 kg)  09/14/17 150 lb (68 kg)    Physical Exam  Results for orders placed or performed in visit on 09/14/18  TSH  Result Value Ref Range   TSH 0.759 0.450 - 4.500 uIU/mL  CBC with Differential/Platelet  Result Value Ref Range   WBC 6.3 3.4 - 10.8 x10E3/uL   RBC 5.78 4.14 - 5.80 x10E6/uL   Hemoglobin 15.0 13.0 - 17.7 g/dL   Hematocrit 45.8 37.5 - 51.0 %   MCV 79 79 - 97 fL   MCH 26.0 (L) 26.6 - 33.0 pg   MCHC 32.8 31.5 - 35.7 g/dL   RDW 13.8 11.6 - 15.4 %   Platelets 272 150 - 450 x10E3/uL   Neutrophils 53 Not Estab. %   Lymphs 34 Not Estab. %   Monocytes 9 Not Estab. %   Eos 3 Not Estab. %   Basos 1 Not Estab. %   Neutrophils Absolute 3.4 1.4 - 7.0 x10E3/uL   Lymphocytes Absolute 2.1 0.7 - 3.1 x10E3/uL   Monocytes Absolute 0.6 0.1 - 0.9 x10E3/uL   EOS (ABSOLUTE) 0.2 0.0 - 0.4 x10E3/uL   Basophils Absolute 0.1 0.0 - 0.2 x10E3/uL   Immature Granulocytes 0 Not Estab. %   Immature Grans (Abs) 0.0 0.0 - 0.1  [id number]/NK  Basic Metabolic Panel (BMET)  Result Value Ref Range   Glucose 76 65 - 99 mg/dL   BUN 11 6 - 20 mg/dL   Creatinine, Ser 0.87 0.76 - 1.27 mg/dL   GFR calc non Af Amer 118 >59 mL/min/1.73   GFR calc Af Amer 137 >59 mL/min/1.73   BUN/Creatinine Ratio 13 9 - 20   Sodium 140 134 - 144 mmol/L   Potassium 4.5 3.5 - 5.2 mmol/L   Chloride 100 96 - 106 mmol/L   CO2 24 20 - 29 mmol/L   Calcium 9.3 8.7 - 10.2 mg/dL      Assessment & Plan:   Problem List Items Addressed This Visit      Other   Depression, recurrent ([place])    Discussed recurrent depression and doing better now off medications will stay off medications for now.          Telemedicine using audio/video telecommunications for a synchronous communication visit. Today's visit due to COVID-19 isolation precautions I connected with and verified that I am speaking with the correct person using two identifiers.   I discussed the limitations, risks, security and privacy concerns of  performing an evaluation and management service by telecommunication and the availability of in person appointments. I also discussed with the patient that there may be a patient responsible charge related to this service. The patient expressed understanding and agreed to proceed. The patient's location is I am at home.   I discussed the assessment and treatment plan with the patient. The patient was provided an opportunity to ask questions and all were answered. The patient agreed with the plan and demonstrated an understanding of the instructions.   The patient was advised to call back or seek an in-person evaluation if the symptoms worsen or if the condition fails to improve as anticipated.   I provided 15+ minutes of time during this encounter. Follow up plan: Return if symptoms worsen or fail to improve.

## 2018-12-08 DIAGNOSIS — M79605 Pain in left leg: Secondary | ICD-10-CM | POA: Diagnosis not present

## 2018-12-08 DIAGNOSIS — Z6824 Body mass index (BMI) 24.0-24.9, adult: Secondary | ICD-10-CM | POA: Diagnosis not present

## 2018-12-08 DIAGNOSIS — M5442 Lumbago with sciatica, left side: Secondary | ICD-10-CM | POA: Diagnosis not present

## 2018-12-08 DIAGNOSIS — F329 Major depressive disorder, single episode, unspecified: Secondary | ICD-10-CM | POA: Diagnosis not present

## 2018-12-08 DIAGNOSIS — Z79899 Other long term (current) drug therapy: Secondary | ICD-10-CM | POA: Diagnosis not present

## 2018-12-18 ENCOUNTER — Ambulatory Visit: Payer: Self-pay | Admitting: *Deleted

## 2018-12-18 ENCOUNTER — Other Ambulatory Visit: Payer: Self-pay

## 2018-12-18 ENCOUNTER — Encounter: Payer: Self-pay | Admitting: Family Medicine

## 2018-12-18 ENCOUNTER — Ambulatory Visit (INDEPENDENT_AMBULATORY_CARE_PROVIDER_SITE_OTHER): Payer: BC Managed Care – PPO | Admitting: Family Medicine

## 2018-12-18 DIAGNOSIS — M5432 Sciatica, left side: Secondary | ICD-10-CM

## 2018-12-18 MED ORDER — GABAPENTIN 300 MG PO CAPS: 300.0000 mg | ORAL_CAPSULE | Freq: Three times a day (TID) | ORAL | 0 refills | Status: DC

## 2018-12-18 MED ORDER — MELOXICAM 15 MG PO TABS: 15.0000 mg | ORAL_TABLET | Freq: Every day | ORAL | 0 refills | Status: DC

## 2018-12-18 NOTE — Telephone Encounter (Signed)
Pt calling with severe pain in the left buttocks that radiates down the leg. Pt states that the pain worsens when going from a sitting to standing position and describes it as a burning sensation. Pt states the pain has occurred of and on for the past 3 months. Pt has been to [place], [facility] and most recently [facility] about 2 weeks ago.Pt states that he was told that it was his sciatic nerve and was prescribed a muscle relaxer, ibuprofen and tramadol which has not given much relief. Pt advised to keep appt at 1 pm today. Pt advised to seek treatment in the ED if he was not able to wait until 1 pm appt today. Pt verbalized understanding.  Reason for Disposition . [1] SEVERE pain (e.g., excruciating, unable to do any normal activities) AND [2] not improved after 2 hours of pain medicine  Answer Assessment - Initial Assessment Questions 1. ONSET: "When did the pain start?"      A couple of weeks ago 2. LOCATION: "Where is the pain located?"      Left buttocks and left leg 3. PAIN: "How bad is the pain?"    (Scale 1-10; or mild, moderate, severe)   -  MILD (1-3): doesn't interfere with normal activities    -  MODERATE (4-7): interferes with normal activities (e.g., work or school) or awakens from sleep, limping    -  SEVERE (8-10): excruciating pain, unable to do any normal activities, unable to walk     Above 10 with standing up, sitting on butt makes pain worse, sharp pain and is lingering pain with standing up and then limps off, kind of goes away with walking 4. WORK OR EXERCISE: "Has there been any recent work or exercise that involved this part of the body?"     Sitting down and standing up pain is above 10 5. CAUSE: "What do you think is causing the leg pain?"     Was previously told it was his sciatic nerve 6. OTHER SYMPTOMS: "Do you have any other symptoms?" (e.g., chest pain, back pain, breathing difficulty, swelling, rash, fever, numbness, weakness)     Left leg foot will  kind of go numb and experienced like a burning sensation 7. PREGNANCY: "Is there any chance you are pregnant?" "When was your last menstrual period?"     n/a  Protocols used: LEG PAIN-A-AH

## 2018-12-18 NOTE — Progress Notes (Signed)
There were no vitals taken for this visit.   Subjective:    Patient ID: Connor Wilson, male    DOB: 08/15/1990, 28 y.o.   MRN: [id number]  HPI: Connor Wilson is a 28 y.o. male  Chief Complaint  Patient presents with   Sciatica     This visit was completed via WebEx due to the restrictions of the COVID-19 pandemic. All issues as above were discussed and addressed. Physical exam was done as above through visual confirmation on WebEx. If it was felt that the patient should be evaluated in the office, they were directed there. The patient verbally consented to this visit.  Location of the patient: home  Location of the provider: home  Those involved with this call:   Provider: Merrie Roof, PA-C  CMA: Tiffany Reel, CMA  Front Desk/Registration: Jill Side   Time spent on call: 15 minutes with patient face to face via video conference. More than 50% of this time was spent in counseling and coordination of care. 5 minutes total spent in review of patient's record and preparation of their chart. I verified patient identity using two factors (patient name and date of birth). Patient consents verbally to being seen via telemedicine visit today.   Has had issues with sciatica for at least 6 months now, for which he has sought care at [facility] and 2 ERs now. Has tried several muscle relaxers, NSAIDs, and lidocaine patches and has had several x-rays which have all been normal. No known injury. Did have a brief period of relief but about 2-3 weeks ago after taking [place] the [place] the pain in his buttocks returned and is now severe coming down the left leg. Having some weakness, numbness and tingling running down toward left foot. Went to [facility] ER since this most recent flare onset. Given shot of toradol and told to rest and do stretches. Standing seems to ease it off more than sitting. Took some vicodin and tramadol that he was able to get on his own but that didn't  help much. Has also had a friend put on her tens unit. States this is significantly impacting his ability to work and care for his two children at this point and he can hardly stand this pain much longer. Does not tolerate prednisone due to mood side effects.   Relevant past medical, surgical, family and social history reviewed and updated as indicated. Interim medical history since our last visit reviewed. Allergies and medications reviewed and updated.  Review of Systems  Per HPI unless specifically indicated above     Objective:    There were no vitals taken for this visit.  Wt Readings from Last 3 Encounters:  09/14/18 148 lb (67.1 kg)  05/09/18 148 lb 12.8 oz (67.5 kg)  09/14/17 150 lb (68 kg)    Physical Exam Vitals signs and nursing note reviewed.  Constitutional:      General: He is not in acute distress.    Appearance: Normal appearance.  HENT:     Head: Atraumatic.     Right Ear: External ear normal.     Left Ear: External ear normal.     Nose: Nose normal. No congestion.     Mouth/Throat:     Mouth: Mucous membranes are moist.     Pharynx: Oropharynx is clear.  Eyes:     Extraocular Movements: Extraocular movements intact.     Conjunctiva/sclera: Conjunctivae normal.  Neck:  Musculoskeletal: Normal range of motion.  Pulmonary:     Effort: Pulmonary effort is normal. No respiratory distress.  Musculoskeletal:     Comments: Antalgic gait, difficult to assess any specific msk components given virtual nature of appt but gross ROM appears intact  Skin:    General: Skin is dry.     Findings: No erythema or rash.  Neurological:     Mental Status: He is oriented to person, place, and time.  Psychiatric:        Mood and Affect: Mood normal.        Thought Content: Thought content normal.        Judgment: Judgment normal.     Results for orders placed or performed in visit on 09/14/18  TSH  Result Value Ref Range   TSH 0.759 0.450 - 4.500 uIU/mL  CBC with  Differential/Platelet  Result Value Ref Range   WBC 6.3 3.4 - 10.8 x10E3/uL   RBC 5.78 4.14 - 5.80 x10E6/uL   Hemoglobin 15.0 13.0 - 17.7 g/dL   Hematocrit 45.8 37.5 - 51.0 %   MCV 79 79 - 97 fL   MCH 26.0 (L) 26.6 - 33.0 pg   MCHC 32.8 31.5 - 35.7 g/dL   RDW 13.8 11.6 - 15.4 %   Platelets 272 150 - 450 x10E3/uL   Neutrophils 53 Not Estab. %   Lymphs 34 Not Estab. %   Monocytes 9 Not Estab. %   Eos 3 Not Estab. %   Basos 1 Not Estab. %   Neutrophils Absolute 3.4 1.4 - 7.0 x10E3/uL   Lymphocytes Absolute 2.1 0.7 - 3.1 x10E3/uL   Monocytes Absolute 0.6 0.1 - 0.9 x10E3/uL   EOS (ABSOLUTE) 0.2 0.0 - 0.4 x10E3/uL   Basophils Absolute 0.1 0.0 - 0.2 x10E3/uL   Immature Granulocytes 0 Not Estab. %   Immature Grans (Abs) 0.0 0.0 - 0.1 [id number]  Basic Metabolic Panel (BMET)  Result Value Ref Range   Glucose 76 65 - 99 mg/dL   BUN 11 6 - 20 mg/dL   Creatinine, Ser 0.87 0.76 - 1.27 mg/dL   GFR calc non Af Amer 118 >59 mL/min/1.73   GFR calc Af Amer 137 >59 mL/min/1.73   BUN/Creatinine Ratio 13 9 - 20   Sodium 140 134 - 144 mmol/L   Potassium 4.5 3.5 - 5.2 mmol/L   Chloride 100 96 - 106 mmol/L   CO2 24 20 - 29 mmol/L   Calcium 9.3 8.7 - 10.2 mg/dL      Assessment & Plan:   Problem List Items Addressed This Visit    None    Visit Diagnoses    Left sided sciatica    -  Primary   Urgent referral to neurosurgery placed. Intolerant to prednisone, trial gabapentin and meloxicam as well as supportive care in meantime. Emergent sxs reviewed   Relevant Medications   gabapentin (NEURONTIN) 300 MG capsule   Other Relevant Orders   Ambulatory referral to Neurosurgery       Follow up plan: Return if symptoms worsen or fail to improve.

## 2018-12-21 DIAGNOSIS — M5416 Radiculopathy, lumbar region: Secondary | ICD-10-CM | POA: Diagnosis not present

## 2018-12-26 ENCOUNTER — Other Ambulatory Visit: Payer: Self-pay | Admitting: Neurosurgery

## 2018-12-26 DIAGNOSIS — M5416 Radiculopathy, lumbar region: Secondary | ICD-10-CM

## 2019-01-01 DIAGNOSIS — M9905 Segmental and somatic dysfunction of pelvic region: Secondary | ICD-10-CM | POA: Diagnosis not present

## 2019-01-01 DIAGNOSIS — M9903 Segmental and somatic dysfunction of lumbar region: Secondary | ICD-10-CM | POA: Diagnosis not present

## 2019-01-01 DIAGNOSIS — M5416 Radiculopathy, lumbar region: Secondary | ICD-10-CM | POA: Diagnosis not present

## 2019-01-01 DIAGNOSIS — M5432 Sciatica, left side: Secondary | ICD-10-CM | POA: Diagnosis not present

## 2019-01-02 DIAGNOSIS — M9905 Segmental and somatic dysfunction of pelvic region: Secondary | ICD-10-CM | POA: Diagnosis not present

## 2019-01-02 DIAGNOSIS — M9903 Segmental and somatic dysfunction of lumbar region: Secondary | ICD-10-CM | POA: Diagnosis not present

## 2019-01-02 DIAGNOSIS — M5416 Radiculopathy, lumbar region: Secondary | ICD-10-CM | POA: Diagnosis not present

## 2019-01-02 DIAGNOSIS — M5432 Sciatica, left side: Secondary | ICD-10-CM | POA: Diagnosis not present

## 2019-01-03 DIAGNOSIS — M9903 Segmental and somatic dysfunction of lumbar region: Secondary | ICD-10-CM | POA: Diagnosis not present

## 2019-01-03 DIAGNOSIS — M9905 Segmental and somatic dysfunction of pelvic region: Secondary | ICD-10-CM | POA: Diagnosis not present

## 2019-01-03 DIAGNOSIS — M5432 Sciatica, left side: Secondary | ICD-10-CM | POA: Diagnosis not present

## 2019-01-03 DIAGNOSIS — M5416 Radiculopathy, lumbar region: Secondary | ICD-10-CM | POA: Diagnosis not present

## 2019-01-05 DIAGNOSIS — M5432 Sciatica, left side: Secondary | ICD-10-CM | POA: Diagnosis not present

## 2019-01-05 DIAGNOSIS — M9905 Segmental and somatic dysfunction of pelvic region: Secondary | ICD-10-CM | POA: Diagnosis not present

## 2019-01-05 DIAGNOSIS — M5416 Radiculopathy, lumbar region: Secondary | ICD-10-CM | POA: Diagnosis not present

## 2019-01-05 DIAGNOSIS — M9903 Segmental and somatic dysfunction of lumbar region: Secondary | ICD-10-CM | POA: Diagnosis not present

## 2019-01-08 DIAGNOSIS — M5416 Radiculopathy, lumbar region: Secondary | ICD-10-CM | POA: Diagnosis not present

## 2019-01-08 DIAGNOSIS — M9905 Segmental and somatic dysfunction of pelvic region: Secondary | ICD-10-CM | POA: Diagnosis not present

## 2019-01-08 DIAGNOSIS — M9903 Segmental and somatic dysfunction of lumbar region: Secondary | ICD-10-CM | POA: Diagnosis not present

## 2019-01-08 DIAGNOSIS — M5432 Sciatica, left side: Secondary | ICD-10-CM | POA: Diagnosis not present

## 2019-01-09 ENCOUNTER — Other Ambulatory Visit: Payer: Self-pay

## 2019-01-10 DIAGNOSIS — M9905 Segmental and somatic dysfunction of pelvic region: Secondary | ICD-10-CM | POA: Diagnosis not present

## 2019-01-10 DIAGNOSIS — M5416 Radiculopathy, lumbar region: Secondary | ICD-10-CM | POA: Diagnosis not present

## 2019-01-10 DIAGNOSIS — M9903 Segmental and somatic dysfunction of lumbar region: Secondary | ICD-10-CM | POA: Diagnosis not present

## 2019-01-10 DIAGNOSIS — M5432 Sciatica, left side: Secondary | ICD-10-CM | POA: Diagnosis not present

## 2019-01-14 ENCOUNTER — Other Ambulatory Visit: Payer: Self-pay | Admitting: Family Medicine

## 2019-01-14 NOTE — Telephone Encounter (Signed)
Requested Prescriptions  Pending Prescriptions Disp Refills  . gabapentin (NEURONTIN) 300 MG capsule [Pharmacy Med Name: GABAPENTIN 300MG  CAPSULES] 90 capsule 0    Sig: TAKE 1 CAPSULE(300 MG) BY MOUTH THREE TIMES DAILY     Neurology: Anticonvulsants - gabapentin Passed - 01/14/2019  8:03 AM      Passed - Valid encounter within last 12 months    Recent Outpatient Visits          3 weeks ago Left sided sciatica   [facility] Volney American, PA-C   1 month ago Depression, recurrent [facility])   [place] [facility], Jeannette How, MD   2 months ago Personal history of alcoholism ([place])   [place] [facility], Jeannette How, MD   4 months ago [place], [place], [place]   8 months ago Recurrent major depressive disorder, in full remission ([place])   [place], Jeannette How, MD             . meloxicam ([place]) 15 MG tablet [Pharmacy Med Name: MELOXICAM 15MG  TABLETS] 30 tablet 0    Sig: TAKE 1 TABLET(15 MG) BY MOUTH DAILY     Analgesics:  COX2 Inhibitors Passed - 01/14/2019  8:03 AM      Passed - HGB in normal range and within 360 days    Hemoglobin  Date Value Ref Range Status  09/14/2018 15.0 13.0 - 17.7 g/dL Final         Passed - Cr in normal range and within 360 days    Creatinine  Date Value Ref Range Status  07/31/2013 1.08 0.60 - 1.30 mg/dL Final   Creatinine, Ser  Date Value Ref Range Status  09/14/2018 0.87 0.76 - 1.27 mg/dL Final         Passed - Patient is not pregnant      Passed - Valid encounter within last 12 months    Recent Outpatient Visits          3 weeks ago Left sided sciatica   [facility] Volney American, PA-C   1 month ago Depression, recurrent [facility])   [facility], Jeannette How, MD   2 months ago Personal history of alcoholism [facility])   [place] [facility], Jeannette How, MD   4 months ago [place], [place]   8 months ago Recurrent major depressive disorder, in full remission [facility])   [facility], Jeannette How, MD

## 2019-01-15 DIAGNOSIS — M5432 Sciatica, left side: Secondary | ICD-10-CM | POA: Diagnosis not present

## 2019-01-15 DIAGNOSIS — M9903 Segmental and somatic dysfunction of lumbar region: Secondary | ICD-10-CM | POA: Diagnosis not present

## 2019-01-15 DIAGNOSIS — M5416 Radiculopathy, lumbar region: Secondary | ICD-10-CM | POA: Diagnosis not present

## 2019-01-15 DIAGNOSIS — M9905 Segmental and somatic dysfunction of pelvic region: Secondary | ICD-10-CM | POA: Diagnosis not present

## 2019-01-17 DIAGNOSIS — M5432 Sciatica, left side: Secondary | ICD-10-CM | POA: Diagnosis not present

## 2019-01-17 DIAGNOSIS — M9903 Segmental and somatic dysfunction of lumbar region: Secondary | ICD-10-CM | POA: Diagnosis not present

## 2019-01-17 DIAGNOSIS — M9905 Segmental and somatic dysfunction of pelvic region: Secondary | ICD-10-CM | POA: Diagnosis not present

## 2019-01-17 DIAGNOSIS — M5416 Radiculopathy, lumbar region: Secondary | ICD-10-CM | POA: Diagnosis not present

## 2019-01-22 DIAGNOSIS — M9905 Segmental and somatic dysfunction of pelvic region: Secondary | ICD-10-CM | POA: Diagnosis not present

## 2019-01-22 DIAGNOSIS — M5416 Radiculopathy, lumbar region: Secondary | ICD-10-CM | POA: Diagnosis not present

## 2019-01-22 DIAGNOSIS — M5432 Sciatica, left side: Secondary | ICD-10-CM | POA: Diagnosis not present

## 2019-01-22 DIAGNOSIS — M9903 Segmental and somatic dysfunction of lumbar region: Secondary | ICD-10-CM | POA: Diagnosis not present

## 2019-01-26 DIAGNOSIS — M5416 Radiculopathy, lumbar region: Secondary | ICD-10-CM | POA: Diagnosis not present

## 2019-01-26 DIAGNOSIS — M9903 Segmental and somatic dysfunction of lumbar region: Secondary | ICD-10-CM | POA: Diagnosis not present

## 2019-01-26 DIAGNOSIS — M9905 Segmental and somatic dysfunction of pelvic region: Secondary | ICD-10-CM | POA: Diagnosis not present

## 2019-01-26 DIAGNOSIS — M5432 Sciatica, left side: Secondary | ICD-10-CM | POA: Diagnosis not present

## 2019-01-29 DIAGNOSIS — M5416 Radiculopathy, lumbar region: Secondary | ICD-10-CM | POA: Diagnosis not present

## 2019-01-29 DIAGNOSIS — M9903 Segmental and somatic dysfunction of lumbar region: Secondary | ICD-10-CM | POA: Diagnosis not present

## 2019-01-29 DIAGNOSIS — M9905 Segmental and somatic dysfunction of pelvic region: Secondary | ICD-10-CM | POA: Diagnosis not present

## 2019-01-29 DIAGNOSIS — M5432 Sciatica, left side: Secondary | ICD-10-CM | POA: Diagnosis not present

## 2019-02-05 DIAGNOSIS — M5416 Radiculopathy, lumbar region: Secondary | ICD-10-CM | POA: Diagnosis not present

## 2019-02-05 DIAGNOSIS — M9905 Segmental and somatic dysfunction of pelvic region: Secondary | ICD-10-CM | POA: Diagnosis not present

## 2019-02-05 DIAGNOSIS — M9903 Segmental and somatic dysfunction of lumbar region: Secondary | ICD-10-CM | POA: Diagnosis not present

## 2019-02-05 DIAGNOSIS — M5432 Sciatica, left side: Secondary | ICD-10-CM | POA: Diagnosis not present

## 2019-02-06 ENCOUNTER — Other Ambulatory Visit: Payer: Self-pay | Admitting: Family Medicine

## 2019-02-06 DIAGNOSIS — F3342 Major depressive disorder, recurrent, in full remission: Secondary | ICD-10-CM

## 2019-02-06 NOTE — Telephone Encounter (Signed)
Requested medication (s) are due for refill today: yes  Requested medication (s) are on the active medication list: yes  Last refill: 11/11/18  Future visit scheduled:no  Notes to clinic:  Review for refill   Requested Prescriptions  Pending Prescriptions Disp Refills   citalopram (CELEXA) 40 MG tablet [Pharmacy Med Name: CITALOPRAM 40MG  TABLETS] 90 tablet 2    Sig: TAKE 1 TABLET(40 MG) BY MOUTH DAILY     Psychiatry:  Antidepressants - SSRI Passed - 02/06/2019  8:50 AM      Passed - Valid encounter within last 6 months    Recent Outpatient Visits          1 month ago Left sided sciatica   [facility] Volney American, PA-C   2 months ago Depression, recurrent [facility])   [place] [facility], Jeannette How, MD   2 months ago Personal history of alcoholism [facility])   [place] [facility], Jeannette How, MD   4 months ago [place], [place], [place]   9 months ago Recurrent major depressive disorder, in full remission [facility])   [place], [place]             Passed - Completed PHQ-2 or PHQ-9 in the last 360 days.

## 2019-02-06 NOTE — Telephone Encounter (Signed)
Routing to provider  

## 2019-02-16 ENCOUNTER — Telehealth: Payer: Self-pay | Admitting: Family Medicine

## 2019-02-16 NOTE — Telephone Encounter (Signed)
Pt was sent a referral to neurologist and he the cost is a lot at this time for the Pt to pay $700 for MRI. So started going to a chiropractor for pain in his back(pinched sciatic nerve) / Pt is still having pain and trouble working and wants to know what he can do until he can pay for the MRI to relieve the pain/ please advise

## 2019-02-16 NOTE — Telephone Encounter (Signed)
He's got the meloxicam he can be taking and can be trying lidocaine patches directly to area that's hurting

## 2019-02-16 NOTE — Telephone Encounter (Signed)
Attempted to reach patient, call went straight to VM. Could not leave message as VM box was full, will try to call again later.

## 2019-02-19 NOTE — Telephone Encounter (Signed)
Patient states that he has tried all of that, he has been going to see the chiropractor weekly. He is going to get the MRI done, so hopefully it can help him.

## 2019-02-26 ENCOUNTER — Other Ambulatory Visit: Payer: Self-pay | Admitting: Family Medicine

## 2019-02-26 MED ORDER — MELOXICAM 15 MG PO TABS: ORAL_TABLET | ORAL | 0 refills | Status: DC

## 2019-02-26 NOTE — Telephone Encounter (Signed)
Medication Refill - Medication: meloxicam (MOBIC) 15 MG tablet  Has the patient contacted their pharmacy? Yes.   (Agent: If no, request that the patient contact the pharmacy for the refill.) (Agent: If yes, when and what did the pharmacy advise?)  Preferred Pharmacy (with phone number or street name):  [facility] [id number], [place] - [place] [phone] (Phone) [phone] (Fax)     Agent: Please be advised that RX refills may take up to 3 business days. We ask that you follow-up with your pharmacy.

## 2019-04-04 ENCOUNTER — Other Ambulatory Visit: Payer: Self-pay

## 2019-04-04 DIAGNOSIS — Z20822 Contact with and (suspected) exposure to covid-19: Secondary | ICD-10-CM

## 2019-04-05 LAB — NOVEL CORONAVIRUS, NAA: SARS-CoV-2, NAA: NOT DETECTED

## 2019-11-02 ENCOUNTER — Other Ambulatory Visit: Payer: Self-pay | Admitting: Family Medicine

## 2019-11-02 DIAGNOSIS — F3342 Major depressive disorder, recurrent, in full remission: Secondary | ICD-10-CM

## 2019-11-02 MED ORDER — CITALOPRAM HYDROBROMIDE 40 MG PO TABS: ORAL_TABLET | ORAL | 0 refills | Status: DC

## 2019-11-02 NOTE — Telephone Encounter (Signed)
Patient requesting citalopram (CELEXA) 40 MG tablet, informed please allow 48 to 72 hour turn around time, patient states he's completely out. Patient scheduled for a transfer of care / medication follow up for 11/06/2019 with [place], [place] AT [place] Phone:  [phone]  Fax:  [phone]

## 2019-11-02 NOTE — Telephone Encounter (Signed)
Patient fails protocol- but dose have appointment in 4 days. Courtesy RF given

## 2019-11-06 ENCOUNTER — Encounter: Payer: Self-pay | Admitting: Family Medicine

## 2019-11-06 ENCOUNTER — Other Ambulatory Visit: Payer: Self-pay

## 2019-11-06 ENCOUNTER — Ambulatory Visit (INDEPENDENT_AMBULATORY_CARE_PROVIDER_SITE_OTHER): Payer: BC Managed Care – PPO | Admitting: Family Medicine

## 2019-11-06 VITALS — BP 115/71 | HR 73 | Temp 97.8°F | Wt 155.0 lb

## 2019-11-06 DIAGNOSIS — M5432 Sciatica, left side: Secondary | ICD-10-CM

## 2019-11-06 DIAGNOSIS — F339 Major depressive disorder, recurrent, unspecified: Secondary | ICD-10-CM

## 2019-11-06 DIAGNOSIS — R454 Irritability and anger: Secondary | ICD-10-CM

## 2019-11-06 DIAGNOSIS — F3342 Major depressive disorder, recurrent, in full remission: Secondary | ICD-10-CM

## 2019-11-06 MED ORDER — CITALOPRAM HYDROBROMIDE 40 MG PO TABS: ORAL_TABLET | ORAL | 3 refills | Status: DC

## 2019-11-06 NOTE — Progress Notes (Signed)
BP 115/71    Pulse 73    Temp 97.8 F (36.6 C) (Oral)    Wt 155 lb (70.3 kg)    SpO2 98%    BMI 25.02 kg/m    Subjective:    Patient ID: Connor Wilson, male    DOB: 04-07-1991, 29 y.o.   MRN: [id number]  HPI: Connor Wilson is a 29 y.o. male  Chief Complaint  Patient presents with   Depression   Medication Refill   Feeling the celexa overall is helpful, but having mood swings, fidgety days, easy irritation with his kids. Prozac caused him to feel "wired up" and the abilify made him sleepy. Went to [facility] in the past but ended up not liking it and tried online counseling but could never get a call back from anyone to get going. Denies SI/HI.   Left sided sciatic nerve still acting up quite a bit, particularly on days where he drives longer periods of time in a day. Has been on gabapentin, muscle relaxers and tylenol without significant benefit. Has seen UC and specialists in the past, and was referred to Neurosurgery but was told he would need to pay 900 dollars for an MRI prior to any discussion of treatment so was unable to move forward with that. Tried chiropractic care which helped temporarily.   Depression screen [facility] 2/9 11/06/2019 09/14/2018 05/09/2018  Decreased Interest 2 1 3   Down, Depressed, Hopeless 2 1 3   PHQ - 2 Score 4 2 6   Altered sleeping 1 1 2   Tired, decreased energy 3 2 3   Change in appetite 2 3 1   Feeling bad or failure about yourself  1 1 1   Trouble concentrating 1 2 3   Moving slowly or fidgety/restless 1 2 2   Suicidal thoughts 0 0 0  PHQ-9 Score 13 13 18   No flowsheet data found.   Relevant past medical, surgical, family and social history reviewed and updated as indicated. Interim medical history since our last visit reviewed. Allergies and medications reviewed and updated.  Review of Systems  Per HPI unless specifically indicated above     Objective:    BP 115/71    Pulse 73    Temp 97.8 F (36.6 C) (Oral)    Wt 155 lb (70.3 kg)    SpO2 98%    BMI  25.02 kg/m   Wt Readings from Last 3 Encounters:  11/06/19 155 lb (70.3 kg)  09/14/18 148 lb (67.1 kg)  05/09/18 148 lb 12.8 oz (67.5 kg)    Physical Exam Vitals and nursing note reviewed.  Constitutional:      Appearance: Normal appearance.  HENT:     Head: Atraumatic.  Eyes:     Extraocular Movements: Extraocular movements intact.     Conjunctiva/sclera: Conjunctivae normal.  Cardiovascular:     Rate and Rhythm: Normal rate and regular rhythm.  Pulmonary:     Effort: Pulmonary effort is normal.     Breath sounds: Normal breath sounds.  Musculoskeletal:        General: Normal range of motion.     Cervical back: Normal range of motion and neck supple.  Skin:    General: Skin is warm and dry.  Neurological:     General: No focal deficit present.     Mental Status: He is oriented to person, place, and time.  Psychiatric:        Mood and Affect: Mood normal.        Thought Content: Thought content  normal.        Judgment: Judgment normal.     Results for orders placed or performed in visit on 04/04/19  Novel Coronavirus, NAA ([facility])   Specimen: Oropharyngeal(OP) collection in vial transport medium  Result Value Ref Range   SARS-CoV-2, NAA Not Detected Not Detected      Assessment & Plan:   Problem List Items Addressed This Visit      Other   Depression - Primary   Relevant Medications   citalopram (CELEXA) 40 MG tablet   Depression, recurrent ([place])    Possibly a bit of a bipolar component, but pt nervous to switch up medications at this point due to multiple past side effects. States he's a single dad and does not want to risk side effects that may affect his ability to care for his children. Will continue celexa regimen for now, and he is agreeable to counseling referral.       Relevant Medications   citalopram (CELEXA) 40 MG tablet   Other Relevant Orders   Ambulatory referral to Psychology    Other Visit Diagnoses    Irritability       Relevant Orders     Ambulatory referral to Psychology   Left sided sciatica       Not ready to go back to Neurosurgery, continue consideration of PT vs chiropractor vs new referral to Neurosurgery. Conservative mgmt in meantime   Relevant Medications   citalopram (CELEXA) 40 MG tablet       Follow up plan: No follow-ups on file.

## 2019-11-06 NOTE — Assessment & Plan Note (Signed)
Possibly a bit of a bipolar component, but pt nervous to switch up medications at this point due to multiple past side effects. States he's a single dad and does not want to risk side effects that may affect his ability to care for his children. Will continue celexa regimen for now, and he is agreeable to counseling referral.

## 2019-12-03 ENCOUNTER — Telehealth: Payer: Self-pay | Admitting: Family Medicine

## 2019-12-03 ENCOUNTER — Other Ambulatory Visit: Payer: Self-pay | Admitting: Family Medicine

## 2019-12-03 DIAGNOSIS — F3342 Major depressive disorder, recurrent, in full remission: Secondary | ICD-10-CM

## 2019-12-03 NOTE — Telephone Encounter (Signed)
Pt stated he is out of celexa medication and would like a refill , Pt requests a call when this is ready.

## 2019-12-03 NOTE — Telephone Encounter (Signed)
Called pharmacy because a year supply was sent in at the beginnig of the month. 90 day supply was picked up on 11/29/19 according to pharmacy.   Tried calling patient to discuss with him, no answer and no VM. Will try to call again later.

## 2019-12-04 NOTE — Telephone Encounter (Signed)
Called [facility]. Apparently the patient has 2 different profiles according to the pharmacy. Patient does have a 90 day supply of medication ready to be picked up.  Called the patient and let him know. He states that he spoke with his father who has the same initials as the patient and that he had picked up something around the date so he is thinking the pharmacy was confused. Thanked me for the call and letting him know.

## 2019-12-04 NOTE — Telephone Encounter (Signed)
Patient returned my call. I explained the situation to him. Patient states he has not picked up any medication since 11/02/19 and it was a 30 day supply. Patient is very agitated with this situation. Told him that I would contact the pharmacy again this morning when they open to try to figure this situation out for him.

## 2020-01-22 DIAGNOSIS — Z23 Encounter for immunization: Secondary | ICD-10-CM | POA: Diagnosis not present

## 2020-01-25 ENCOUNTER — Encounter: Payer: Self-pay | Admitting: Family Medicine

## 2020-08-25 ENCOUNTER — Telehealth: Payer: Self-pay

## 2020-08-25 NOTE — Telephone Encounter (Signed)
Copied from [place] [id number]. Topic: Appointment Scheduling - Scheduling Inquiry for Clinic >> Aug 25, 2020  1:35 PM Greggory Keen D wrote: Reason for CRM: Pt called saying he has a rally bad sore throat.  Head feels "cloudy"  CB#  [phone]   LVM TO MAKE THIS APT HAS TO BE VIRTUAL

## 2020-11-06 ENCOUNTER — Encounter: Payer: BC Managed Care – PPO | Admitting: Family Medicine

## 2020-11-06 ENCOUNTER — Encounter: Payer: BC Managed Care – PPO | Admitting: Nurse Practitioner

## 2020-11-21 ENCOUNTER — Other Ambulatory Visit: Payer: Self-pay | Admitting: Family Medicine

## 2020-11-21 DIAGNOSIS — F3342 Major depressive disorder, recurrent, in full remission: Secondary | ICD-10-CM

## 2020-11-21 NOTE — Telephone Encounter (Signed)
   Notes to clinic: Patient no showed appt on 11/06/2020 Review for refill    Requested Prescriptions  Pending Prescriptions Disp Refills   citalopram (CELEXA) 40 MG tablet [Pharmacy Med Name: CITALOPRAM 40MG  TABLETS] 90 tablet 3    Sig: TAKE 1 TABLET(40 MG) BY MOUTH DAILY      Psychiatry:  Antidepressants - SSRI Failed - 11/21/2020  3:18 AM      Failed - Completed PHQ-2 or PHQ-9 in the last 360 days      Failed - Valid encounter within last 6 months    Recent Outpatient Visits           1 year ago Recurrent major depressive disorder, in full remission [facility])   [facility] Volney American, [place]   1 year ago Left sided sciatica   [facility] Volney American, [place]   1 year ago Depression, recurrent [facility])   [facility] [place], Jeannette How, [place]   2 years ago Personal history of alcoholism [facility])   [place], Jeannette How, [place]   2 years ago [place], [place], [place]

## 2020-11-24 ENCOUNTER — Other Ambulatory Visit: Payer: Self-pay

## 2020-11-24 ENCOUNTER — Ambulatory Visit (INDEPENDENT_AMBULATORY_CARE_PROVIDER_SITE_OTHER): Payer: BC Managed Care – PPO | Admitting: Internal Medicine

## 2020-11-24 ENCOUNTER — Encounter: Payer: Self-pay | Admitting: Internal Medicine

## 2020-11-24 VITALS — BP 113/74 | HR 80 | Temp 98.4°F | Ht 64.76 in | Wt 148.6 lb

## 2020-11-24 DIAGNOSIS — F3342 Major depressive disorder, recurrent, in full remission: Secondary | ICD-10-CM | POA: Diagnosis not present

## 2020-11-24 DIAGNOSIS — M79604 Pain in right leg: Secondary | ICD-10-CM | POA: Diagnosis not present

## 2020-11-24 DIAGNOSIS — G8929 Other chronic pain: Secondary | ICD-10-CM | POA: Diagnosis not present

## 2020-11-24 DIAGNOSIS — M549 Dorsalgia, unspecified: Secondary | ICD-10-CM

## 2020-11-24 MED ORDER — CITALOPRAM HYDROBROMIDE 40 MG PO TABS: ORAL_TABLET | ORAL | 3 refills | Status: DC

## 2020-11-24 MED ORDER — LIDOCAINE 5 % EX PTCH: 1.0000 | MEDICATED_PATCH | CUTANEOUS | 2 refills | Status: DC

## 2020-11-24 NOTE — Telephone Encounter (Signed)
Seen today possible duplicate

## 2020-11-24 NOTE — Progress Notes (Signed)
BP 113/74   Pulse 80   Temp 98.4 F (36.9 C) (Oral)   Ht 5' 4.76" (1.645 m)   Wt 148 lb 9.6 oz (67.4 kg)   SpO2 99%   BMI 24.91 kg/m    Subjective:    Patient ID: Connor Wilson, male    DOB: 1991-03-08, 30 y.o.   MRN: [id number]  Chief Complaint  Patient presents with   Depression    Would like to have a refill of his Celex   Back Pain    Mainly lower back and shoulders    HPI: Connor Wilson is a 30 y.o. male  Depression        This is a chronic (is on celexa for depression and anxiety sec to deaths celexa helps. SAW - sleep appetite and weight stable.) problem. 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 The current episode started more than 1 month ago.   Chief Complaint  Patient presents with   Depression    Would like to have a refill of his Celex   Back Pain    Mainly lower back and shoulders    Relevant past medical, surgical, family and social history reviewed and updated as indicated. Interim medical history since our last visit reviewed. Allergies and medications reviewed and updated.  Review of Systems  Musculoskeletal:  Positive for back pain.  Psychiatric/Behavioral:  Positive for depression.    Per HPI unless specifically indicated above     Objective:    BP 113/74   Pulse 80   Temp 98.4 F (36.9 C) (Oral)   Ht 5' 4.76" (1.645 m)   Wt 148 lb 9.6 oz (67.4 kg)   SpO2 99%   BMI 24.91 kg/m   Wt Readings from Last 3 Encounters:  11/24/20 148 lb 9.6 oz (67.4 kg)  11/06/19 155 lb (70.3 kg)  09/14/18 148 lb (67.1 kg)    Physical Exam Vitals and nursing note reviewed.  Constitutional:      General: He is not in acute distress.    Appearance: Normal appearance. He is not ill-appearing or diaphoretic.  HENT:     Head: Normocephalic and atraumatic.     Right Ear: Tympanic membrane and external ear normal.  There is no impacted cerumen.     Left Ear: External ear normal.     Nose: No congestion or rhinorrhea.     Mouth/Throat:     Pharynx: No oropharyngeal exudate or posterior oropharyngeal erythema.  Eyes:     Conjunctiva/sclera: Conjunctivae normal.     Pupils: Pupils are equal, round, and reactive to light.  Cardiovascular:     Rate and Rhythm: Normal rate and regular rhythm.     Heart sounds: No murmur heard.   No friction rub. No gallop.  Pulmonary:     Effort: No respiratory distress.     Breath sounds: No stridor. No wheezing or rhonchi.  Chest:     Chest wall: No tenderness.  Abdominal:     General: Abdomen is flat. Bowel sounds are normal.     Palpations: Abdomen is soft. There is no mass.     Tenderness: There is no abdominal tenderness.  Musculoskeletal:        General: Swelling and deformity present.     Cervical back: Normal range of motion and neck supple. No rigidity or  tenderness.     Left lower leg: No edema.  Skin:    General: Skin is warm and dry.  Neurological:     Mental Status: He is alert.   Results for orders placed or performed in visit on 04/04/19  Novel Coronavirus, NAA ([facility])   Specimen: Oropharyngeal(OP) collection in vial transport medium  Result Value Ref Range   SARS-CoV-2, NAA Not Detected Not Detected        Current Outpatient Medications:    citalopram (CELEXA) 40 MG tablet, TAKE 1 TABLET(40 MG) BY MOUTH DAILY, Disp: 90 tablet, Rfl: 3    Assessment & Plan:  Back pain  Will refer for PT and ortho check L spine xray today  probably secondary to muscle spasms relieved with ibuprofen as prescribed in the ER UA negative no UTI patient does not have any symptoms either. will need ? MRI of back if pain persists.  most likely musckulskelteal though  adviced streches for back  Patient advised to take medication as directed here. Patient advised to rest initially and then slowly increase activity level. Monitor changes in symptoms such as  numbness, tingling or weakness in legs, changes in bowel or bladder habits or worsening back pain. Proper ergonomics discussed. Referral to physical therapy as needed. Patient will call if symptoms worsen or if pain persists greater than 8 weeks.  Right lower ext had a fracture seen ortho for such in the past Still has pain in the Rt leg Will need to fu   Depression / anxiety  Is on celexa for such    Problem List Items Addressed This Visit   None    No orders of the defined types were placed in this encounter.    No orders of the defined types were placed in this encounter.    Follow up plan: No follow-ups on file.

## 2020-11-27 ENCOUNTER — Telehealth: Payer: Self-pay

## 2020-11-27 NOTE — Telephone Encounter (Signed)
Could you please call patient and see whats going on thanks!

## 2020-11-27 NOTE — Telephone Encounter (Signed)
Patient states that his insurance may not cover the referrals or xray and does not want to pay out of pocket.

## 2020-11-27 NOTE — Telephone Encounter (Signed)
Copied from [place] [id number]. Topic: General - Other >> Nov 27, 2020  1:52 PM Alanda Slim E wrote: Reason for CRM: Pt received referrals and an order for an Xray/ pt stated he is not going to need te referrals or do the Xray due to possible cost or non coverage by his insurance/ please advise

## 2020-12-09 ENCOUNTER — Other Ambulatory Visit: Payer: BC Managed Care – PPO

## 2020-12-15 ENCOUNTER — Ambulatory Visit: Payer: BC Managed Care – PPO | Admitting: Internal Medicine

## 2020-12-15 ENCOUNTER — Other Ambulatory Visit: Payer: Self-pay

## 2021-04-28 DIAGNOSIS — H5213 Myopia, bilateral: Secondary | ICD-10-CM | POA: Diagnosis not present

## 2021-08-06 ENCOUNTER — Ambulatory Visit: Payer: Self-pay | Admitting: *Deleted

## 2021-08-06 ENCOUNTER — Ambulatory Visit (INDEPENDENT_AMBULATORY_CARE_PROVIDER_SITE_OTHER): Payer: BC Managed Care – PPO | Admitting: Internal Medicine

## 2021-08-06 ENCOUNTER — Encounter: Payer: Self-pay | Admitting: Internal Medicine

## 2021-08-06 ENCOUNTER — Other Ambulatory Visit: Payer: Self-pay

## 2021-08-06 VITALS — BP 109/69 | HR 80 | Temp 98.5°F | Ht 64.76 in | Wt 154.2 lb

## 2021-08-06 DIAGNOSIS — R52 Pain, unspecified: Secondary | ICD-10-CM

## 2021-08-06 LAB — URINALYSIS, ROUTINE W REFLEX MICROSCOPIC
Bilirubin, UA: NEGATIVE
Ketones, UA: NEGATIVE
Leukocytes,UA: NEGATIVE
Nitrite, UA: NEGATIVE
Protein,UA: NEGATIVE
RBC, UA: NEGATIVE
Specific Gravity, UA: 1.02 (ref 1.005–1.030)
Urobilinogen, Ur: 0.2 mg/dL (ref 0.2–1.0)
pH, UA: 6 (ref 5.0–7.5)

## 2021-08-06 LAB — BAYER DCA HB A1C WAIVED: HB A1C (BAYER DCA - WAIVED): 5.1 % (ref 4.8–5.6)

## 2021-08-06 MED ORDER — FAMOTIDINE 20 MG PO TABS: 20.0000 mg | ORAL_TABLET | Freq: Two times a day (BID) | ORAL | 3 refills | Status: DC

## 2021-08-06 NOTE — Patient Instructions (Signed)
Oragel  ?

## 2021-08-06 NOTE — Telephone Encounter (Signed)
Reason for Disposition ? Face swelling is painful to touch ?   Lower lip is swollen, numb and tingling on and off for a couple of years. ? ?Answer Assessment - Initial Assessment Questions ?1. ONSET: "When did the swelling start?" (e.g., minutes, hours, days) ?    The bottom lip is swollen.   I have a pain between my gum and lower lip for a couple of years on and off.   It feels numb and tingling and hurting.   It gets worse with my anxiety and if I drink sugar.    I just went to the dentist and I don't have any cavities.   When I went to the dentist it wasn't flared up.   ?2. LOCATION: "What part of the face is swollen?" ?    Lower lip swollen, tingling and numb. ?3. SEVERITY: "How swollen is it?" ?    No swelling of throat, tongue or inside of mouth.    ?I thought it was anxiety for the last couple of years but it did not get numb. ?4. ITCHING: "Is there any itching?" If Yes, ask: "How much?"   (Scale 1-10; mild, moderate or severe) ?    No ?5. PAIN: "Is the swelling painful to touch?" If Yes, ask: "How painful is it?"   (Scale 1-10; mild, moderate or severe) ?  - NONE (0): no pain ?  - MILD (1-3): doesn't interfere with normal activities  ?  - MODERATE (4-7): interferes with normal activities or awakens from sleep  ?  - SEVERE (8-10): excruciating pain, unable to do any normal activities  ?    Feels weird. ?6. FEVER: "Do you have a fever?" If Yes, ask: "What is it, how was it measured, and when did it start?"  ?    *No Answer* ?7. CAUSE: "What do you think is causing the face swelling?" ?    *No Answer* ?8. RECURRENT SYMPTOM: "Have you had face swelling before?" If Yes, ask: "When was the last time?" "What happened that time?" ?    *No Answer* ?9. OTHER SYMPTOMS: "Do you have any other symptoms?" (e.g., toothache, leg swelling) ?    *No Answer* ?10. PREGNANCY: "Is there any chance you are pregnant?" "When was your last menstrual period?" ?      *No Answer* ? ?Protocols used: Face Swelling-A-AH ? ?

## 2021-08-06 NOTE — Progress Notes (Signed)
BP 109/69    Pulse 80    Temp 98.5 F (36.9 C) (Oral)    Ht 5' 4.76" (1.645 m)    Wt 154 lb 3.2 oz (69.9 kg)    SpO2 97%    BMI 25.85 kg/m    Subjective:    Patient ID: Connor Wilson, male    DOB: September 29, 1990, 31 y.o.   MRN: [id number]  Chief Complaint  Patient presents with   Numbness    Lower lip has been on and off going numb for past few years, has been bothering him a lot this past week    HPI: Connor Wilson is a 31 y.o. male  Pt is here for a follow up. Co lower lip tingles and when he gets very anxious he feels it gets worse. He denies any such symptoms on toungue or face, or hands or feet.      Anxiety Presents for follow-up (is on celexa) visit. Patient reports no chest pain, confusion, decreased concentration, dizziness, nausea, palpitations, shortness of breath or suicidal ideas. Primary symptoms comment: eats hot sauce makes it worse.    Depression      (eats hot sauce makes it worse)  This is a chronic problem.  Associated symptoms include no decreased concentration, no fatigue, no appetite change, no myalgias, no headaches and no suicidal ideas.  Past medical history includes anxiety.   Gastroesophageal Reflux He complains of heartburn. He reports no abdominal pain, no chest pain, no coughing, no nausea or no wheezing. eats hot sauce makes it worse. Pertinent negatives include no fatigue.   Chief Complaint  Patient presents with   Numbness    Lower lip has been on and off going numb for past few years, has been bothering him a lot this past week    Relevant past medical, surgical, family and social history reviewed and updated as indicated. Interim medical history since our last visit reviewed. Allergies and medications reviewed and updated.  Review of Systems  Constitutional:  Negative for activity change, appetite change, chills, fatigue and fever.  HENT:  Negative for congestion, ear discharge, ear pain and facial swelling.   Eyes:  Negative for  pain, discharge and itching.  Respiratory:  Negative for cough, chest tightness, shortness of breath and wheezing.   Cardiovascular:  Negative for chest pain, palpitations and leg swelling.  Gastrointestinal:  Positive for heartburn. Negative for abdominal distention, abdominal pain, blood in stool, constipation, diarrhea, nausea and vomiting.  Endocrine: Negative for cold intolerance, heat intolerance, polydipsia, polyphagia and polyuria.  Genitourinary:  Negative for difficulty urinating, dysuria, flank pain, frequency, hematuria and urgency.  Musculoskeletal:  Negative for arthralgias, gait problem, joint swelling and myalgias.  Skin:  Negative for color change, rash and wound.  Neurological:  Negative for dizziness, tremors, speech difficulty, weakness, light-headedness, numbness and headaches.  Hematological:  Does not bruise/bleed easily.  Psychiatric/Behavioral:  Positive for depression. Negative for agitation, confusion, decreased concentration, sleep disturbance and suicidal ideas.    Per HPI unless specifically indicated above     Objective:    BP 109/69    Pulse 80    Temp 98.5 F (36.9 C) (Oral)    Ht 5' 4.76" (1.645 m)    Wt 154 lb 3.2 oz (69.9 kg)    SpO2 97%    BMI 25.85 kg/m   Wt Readings from Last 3 Encounters:  08/06/21 154 lb 3.2 oz (69.9 kg)  11/24/20 148 lb 9.6 oz (67.4 kg)  11/06/19 155  lb (70.3 kg)    Physical Exam Vitals and nursing note reviewed.  Constitutional:      General: He is not in acute distress.    Appearance: Normal appearance. He is not ill-appearing or diaphoretic.  HENT:     Head: Normocephalic and atraumatic.     Right Ear: External ear normal. There is no impacted cerumen.     Left Ear: External ear normal.     Mouth/Throat:     Mouth: Mucous membranes are dry.     Comments: Dryness noted on lips along with chapping of such  Eyes:     Conjunctiva/sclera: Conjunctivae normal.     Pupils: Pupils are equal, round, and reactive to light.   Cardiovascular:     Rate and Rhythm: Normal rate and regular rhythm.     Heart sounds: No murmur heard.   No friction rub. No gallop.  Pulmonary:     Effort: No respiratory distress.     Breath sounds: No stridor. No wheezing or rhonchi.  Chest:     Chest wall: No tenderness.  Musculoskeletal:     Cervical back: Normal range of motion and neck supple. No rigidity or tenderness.     Left lower leg: No edema.  Skin:    General: Skin is warm and dry.  Neurological:     Mental Status: He is alert.    Results for orders placed or performed in visit on 08/06/21  CBC with Differential/Platelet  Result Value Ref Range   WBC 7.8 3.4 - 10.8 x10E3/uL   RBC 6.04 (H) 4.14 - 5.80 x10E6/uL   Hemoglobin 15.4 13.0 - 17.7 g/dL   Hematocrit 46.7 37.5 - 51.0 %   MCV 77 (L) 79 - 97 fL   MCH 25.5 (L) 26.6 - 33.0 pg   MCHC 33.0 31.5 - 35.7 g/dL   RDW 13.7 11.6 - 15.4 %   Platelets 292 150 - 450 x10E3/uL   Neutrophils 60 Not Estab. %   Lymphs 30 Not Estab. %   Monocytes 7 Not Estab. %   Eos 2 Not Estab. %   Basos 1 Not Estab. %   Neutrophils Absolute 4.7 1.4 - 7.0 x10E3/uL   Lymphocytes Absolute 2.3 0.7 - 3.1 x10E3/uL   Monocytes Absolute 0.5 0.1 - 0.9 x10E3/uL   EOS (ABSOLUTE) 0.2 0.0 - 0.4 x10E3/uL   Basophils Absolute 0.1 0.0 - 0.2 x10E3/uL   Immature Granulocytes 0 Not Estab. %   Immature Grans (Abs) 0.0 0.0 - 0.1 x10E3/uL  Comprehensive metabolic panel  Result Value Ref Range   Glucose 136 (H) 70 - 99 mg/dL   BUN 13 6 - 20 mg/dL   Creatinine, Ser 0.87 0.76 - 1.27 mg/dL   eGFR 119 >59 mL/min/1.73   BUN/Creatinine Ratio 15 9 - 20   Sodium 140 134 - 144 mmol/L   Potassium 3.9 3.5 - 5.2 mmol/L   Chloride 101 96 - 106 mmol/L   CO2 20 20 - 29 mmol/L   Calcium 9.2 8.7 - 10.2 mg/dL   Total Protein 6.4 6.0 - 8.5 g/dL   Albumin 4.7 4.1 - 5.2 g/dL   Globulin, Total 1.7 1.5 - 4.5 g/dL   Albumin/Globulin Ratio 2.8 (H) 1.2 - 2.2   Bilirubin Total <0.2 0.0 - 1.2 mg/dL   Alkaline  Phosphatase 99 44 - 121 IU/L   AST 19 0 - 40 IU/L   ALT 21 0 - 44 IU/L  TSH  Result Value Ref Range   TSH 0.873  0.450 - 4.500 uIU/mL  Urinalysis, Routine w reflex microscopic  Result Value Ref Range   Specific Gravity, UA 1.020 1.005 - 1.030   pH, UA 6.0 5.0 - 7.5   Color, UA Yellow Yellow   Appearance Ur Clear Clear   Leukocytes,UA Negative Negative   Protein,UA Negative Negative/Trace   Glucose, UA Trace (A) Negative   Ketones, UA Negative Negative   RBC, UA Negative Negative   Bilirubin, UA Negative Negative   Urobilinogen, Ur 0.2 0.2 - 1.0 mg/dL   Nitrite, UA Negative Negative  Bayer DCA Hb A1c Waived (STAT)  Result Value Ref Range   HB A1C (BAYER DCA - WAIVED) 5.1 4.8 - 5.6 %        Current Outpatient Medications:    citalopram (CELEXA) 40 MG tablet, TAKE 1 TABLET(40 MG) BY MOUTH DAILY, Disp: 90 tablet, Rfl: 3   famotidine (PEPCID) 20 MG tablet, Take 1 tablet (20 mg total) by mouth 2 (two) times daily., Disp: 30 tablet, Rfl: 3    Assessment & Plan:  Anxiety ? Tingling of lips occurs when anxious  - Causing numbness of the lips Will start pt on celexa.   2. GERD avoid food that triggers such Start pt on famotidine for such  patient advised to avoid laying down soon after his meals. He took a 2 hours between dinner and bedtime. Avoid spicy food and triggers that he knows food wise that worsen his acid reflux. Patient verbalized understanding of the above. Lifestyle modifications as above discussed with patient.   Problem List Items Addressed This Visit   None Visit Diagnoses     Tingling pain    -  Primary   Relevant Orders   CBC with Differential/Platelet (Completed)   Comprehensive metabolic panel (Completed)   TSH (Completed)   Urinalysis, Routine w reflex microscopic (Completed)   Bayer DCA Hb A1c Waived (STAT) (Completed)        Orders Placed This Encounter  Procedures   CBC with Differential/Platelet   Comprehensive metabolic panel   TSH    Urinalysis, Routine w reflex microscopic   Bayer DCA Hb A1c Waived (STAT)     Meds ordered this encounter  Medications   famotidine (PEPCID) 20 MG tablet    Sig: Take 1 tablet (20 mg total) by mouth 2 (two) times daily.    Dispense:  30 tablet    Refill:  3     Follow up plan: Return in about 3 months (around 11/06/2021).

## 2021-08-06 NOTE — Telephone Encounter (Signed)
?  Chief Complaint: lower lip swollen, tingling and numb on and off for the last couple of years. ?Symptoms: intermittent symptoms listed above ?Frequency: intermittently for the last couple of years. ?Pertinent Negatives: Patient denies swelling of throat, tongue, mouth.   No new medications started. ?Disposition: [] ED /[] Urgent Care (no appt availability in office) / [x] Appointment(In office/virtual)/ []  [place] [facility]/ [] Home Care/ [] Refused Recommended Disposition /[] [facility]/ []  Follow-up with PCP ?Additional Notes: Appt made for today with Dr. Neomia Dear at 2:40.    Instructed to go to the ED if he developed shortness of breath, throat, tongue or mouth swelling or chest pain.   He verbalized understanding.  ?

## 2021-08-07 LAB — CBC WITH DIFFERENTIAL/PLATELET
Basophils Absolute: 0.1 10*3/uL (ref 0.0–0.2)
Basos: 1 %
EOS (ABSOLUTE): 0.2 10*3/uL (ref 0.0–0.4)
Eos: 2 %
Hematocrit: 46.7 % (ref 37.5–51.0)
Hemoglobin: 15.4 g/dL (ref 13.0–17.7)
Immature Grans (Abs): 0 10*3/uL (ref 0.0–0.1)
Immature Granulocytes: 0 %
Lymphocytes Absolute: 2.3 10*3/uL (ref 0.7–3.1)
Lymphs: 30 %
MCH: 25.5 pg — ABNORMAL LOW (ref 26.6–33.0)
MCHC: 33 g/dL (ref 31.5–35.7)
MCV: 77 fL — ABNORMAL LOW (ref 79–97)
Monocytes Absolute: 0.5 10*3/uL (ref 0.1–0.9)
Monocytes: 7 %
Neutrophils Absolute: 4.7 10*3/uL (ref 1.4–7.0)
Neutrophils: 60 %
Platelets: 292 10*3/uL (ref 150–450)
RBC: 6.04 x10E6/uL — ABNORMAL HIGH (ref 4.14–5.80)
RDW: 13.7 % (ref 11.6–15.4)
WBC: 7.8 10*3/uL (ref 3.4–10.8)

## 2021-08-07 LAB — COMPREHENSIVE METABOLIC PANEL
ALT: 21 IU/L (ref 0–44)
AST: 19 IU/L (ref 0–40)
Albumin/Globulin Ratio: 2.8 — ABNORMAL HIGH (ref 1.2–2.2)
Albumin: 4.7 g/dL (ref 4.1–5.2)
Alkaline Phosphatase: 99 IU/L (ref 44–121)
BUN/Creatinine Ratio: 15 (ref 9–20)
BUN: 13 mg/dL (ref 6–20)
Bilirubin Total: <0.2 mg/dL (ref 0.0–1.2)
CO2: 20 mmol/L (ref 20–29)
Calcium: 9.2 mg/dL (ref 8.7–10.2)
Chloride: 101 mmol/L (ref 96–106)
Creatinine, Ser: 0.87 mg/dL (ref 0.76–1.27)
Globulin, Total: 1.7 g/dL (ref 1.5–4.5)
Glucose: 136 mg/dL — ABNORMAL HIGH (ref 70–99)
Potassium: 3.9 mmol/L (ref 3.5–5.2)
Sodium: 140 mmol/L (ref 134–144)
Total Protein: 6.4 g/dL (ref 6.0–8.5)
eGFR: 119 mL/min/{1.73_m2} (ref >59)

## 2021-08-07 LAB — TSH: TSH: 0.873 u[IU]/mL (ref 0.450–4.500)

## 2021-08-14 ENCOUNTER — Encounter: Payer: Self-pay | Admitting: Internal Medicine

## 2021-08-17 NOTE — Telephone Encounter (Signed)
Talked with patient, he wanted appt with a dentist to figure out why his mouth hurts and is tingling. Appt was made with Dr. Wynetta Emery on 6/6 at 10am ?

## 2021-10-19 ENCOUNTER — Ambulatory Visit (INDEPENDENT_AMBULATORY_CARE_PROVIDER_SITE_OTHER): Payer: BC Managed Care – PPO | Admitting: Physician Assistant

## 2021-10-19 ENCOUNTER — Encounter: Payer: Self-pay | Admitting: Physician Assistant

## 2021-10-19 VITALS — BP 126/74 | HR 85 | Temp 98.3°F | Ht 64.76 in | Wt 155.6 lb

## 2021-10-19 DIAGNOSIS — J029 Acute pharyngitis, unspecified: Secondary | ICD-10-CM | POA: Diagnosis not present

## 2021-10-19 NOTE — Progress Notes (Signed)
? ? ? ?   Established Patient Office Visit ? ?Name: Connor Wilson   MRN: [id number]    DOB: 1990-12-20   Date:10/19/2021 ? ?Today's Provider: Talitha Givens, MHS, PA-C ?Introduced myself to the patient as a Journalist, newspaper and provided education on APPs in clinical practice.  ? ?      ?Subjective ? ?Chief Complaint ? ?Chief Complaint  ?Patient presents with  ? Sore Throat  ?  Had about 2 weeks ago and took some of his kids leftover abx, went away but started again on Friday  ? ? ?HPI ? ?Onset: States it started about 2 weeks ago and went away  ?Reports he took some medications, unsure what abx it was as it was for his children  ?Associated symptoms: intermittent pain under left ear that shoots down through neck ?Alleviating : Nothing in particular, cough drops provide mild, limited relief ?Aggravating: Alcohol  ?Interventions: Allergy medications  ? ? ? ? ? ?Patient Active Problem List  ? Diagnosis Date Noted  ? Right leg pain 11/24/2020  ? Chronic back pain 11/24/2020  ? Depression, recurrent ([place]) 11/09/2018  ? Personal history of alcoholism ([place]) 05/09/2018  ? Eczema 03/05/2015  ? ? ?Past Surgical History:  ?Procedure Laterality Date  ? FRACTURE SURGERY Right 2010  ? leg  ? GASTRIC OUTLET OBSTRUCTION RELEASE    ? 18 weeks old  ? lymph node removal Left   ? LYMPHADENECTOMY    ? ? ?Family History  ?Problem Relation Age of Onset  ? Hyperlipidemia Father   ? Diabetes Mother   ? Cancer Maternal Grandmother   ? ? ?Social History  ? ?Tobacco Use  ? Smoking status: Former  ?  Types: Cigarettes  ?  Quit date: 01/28/2011  ?  Years since quitting: 10.7  ? Smokeless tobacco: Never  ?Substance Use Topics  ? Alcohol use: Yes  ?  Alcohol/week: 8.0 standard drinks  ?  Types: 8 Glasses of wine per week  ?  Comment: occasional  ? ? ? ?Current Outpatient Medications:  ?  citalopram (CELEXA) 40 MG tablet, TAKE 1 TABLET(40 MG) BY MOUTH DAILY, Disp: 90 tablet, Rfl: 3 ?  famotidine (PEPCID) 20 MG tablet, Take 1 tablet (20 mg total) by mouth 2 (two)  times daily., Disp: 30 tablet, Rfl: 3 ? ?Allergies  ?Allergen Reactions  ? Abilify [Aripiprazole] Other (See Comments)  ?  sleepy  ? ? ? ? ? ?Review of Systems  ?Constitutional:  Negative for fever.  ?HENT:  Positive for congestion, ear pain and sore throat.   ?     Trouble swallowing due to pain ?  ?Respiratory:  Positive for cough. Negative for shortness of breath.   ?Gastrointestinal:  Negative for diarrhea, nausea and vomiting.  ?Musculoskeletal:  Negative for joint pain and myalgias.  ?Neurological:  Positive for headaches. Negative for dizziness and tingling.  ? ? ? ?Objective ? ?Vitals:  ? 10/19/21 0854  ?BP: 126/74  ?Pulse: 85  ?Temp: 98.3 ?F (36.8 ?C)  ?TempSrc: Oral  ?SpO2: 98%  ?Weight: 155 lb 9.6 oz (70.6 kg)  ?Height: 5' 4.76" (1.645 m)  ? ? ?Body mass index is 26.08 kg/m?. ? ?Physical Exam ?Vitals reviewed.  ?Constitutional:   ?   General: He is awake.  ?   Appearance: Normal appearance. He is well-developed, well-groomed and normal weight.  ?HENT:  ?   Head: Normocephalic and atraumatic.  ?   Right Ear: Tympanic membrane and ear canal normal. No swelling. No middle ear  effusion. Tympanic membrane is not erythematous.  ?   Left Ear: Tympanic membrane and ear canal normal. No swelling.  No middle ear effusion. Tympanic membrane is not erythematous.  ?   Nose: No congestion or rhinorrhea.  ?   Mouth/Throat:  ?   Mouth: Mucous membranes are moist.  ?   Pharynx: Posterior oropharyngeal erythema present. No pharyngeal swelling, oropharyngeal exudate or uvula swelling.  ?   Tonsils: No tonsillar exudate or tonsillar abscesses.  ?Eyes:  ?   Conjunctiva/sclera: Conjunctivae normal.  ?Cardiovascular:  ?   Rate and Rhythm: Normal rate and regular rhythm.  ?   Heart sounds: Normal heart sounds. No murmur heard. ?  No friction rub. No gallop.  ?Lymphadenopathy:  ?   Head:  ?   Right side of head: No submental or submandibular adenopathy.  ?   Left side of head: No submental or submandibular adenopathy.  ?   Upper  Body:  ?   Right upper body: No supraclavicular adenopathy.  ?   Left upper body: No supraclavicular adenopathy.  ?Neurological:  ?   Mental Status: He is alert.  ?Psychiatric:     ?   Mood and Affect: Mood normal.     ?   Behavior: Behavior normal. Behavior is cooperative.  ? ? ? ?Recent Results (from the past 2160 hour(s))  ?Urinalysis, Routine w reflex microscopic     Status: Abnormal  ? Collection Time: 08/06/21  3:01 PM  ?Result Value Ref Range  ? Specific Gravity, UA 1.020 1.005 - 1.030  ? pH, UA 6.0 5.0 - 7.5  ? Color, UA Yellow Yellow  ? Appearance Ur Clear Clear  ? Leukocytes,UA Negative Negative  ? Protein,UA Negative Negative/Trace  ? Glucose, UA Trace (A) Negative  ? Ketones, UA Negative Negative  ? RBC, UA Negative Negative  ? Bilirubin, UA Negative Negative  ? Urobilinogen, Ur 0.2 0.2 - 1.0 mg/dL  ? Nitrite, UA Negative Negative  ?Bayer DCA Hb A1c Waived (STAT)     Status: None  ? Collection Time: 08/06/21  3:01 PM  ?Result Value Ref Range  ? HB A1C (BAYER DCA - WAIVED) 5.1 4.8 - 5.6 %  ?  Comment:          Prediabetes: 5.7 - 6.4 ?         Diabetes: >6.4 ?         Glycemic control for adults with diabetes: <7.0 ?  ?CBC with Differential/Platelet     Status: Abnormal  ? Collection Time: 08/06/21  3:03 PM  ?Result Value Ref Range  ? WBC 7.8 3.4 - 10.8 x10E3/uL  ? RBC 6.04 (H) 4.14 - 5.80 x10E6/uL  ? Hemoglobin 15.4 13.0 - 17.7 g/dL  ? Hematocrit 46.7 37.5 - 51.0 %  ? MCV 77 (L) 79 - 97 fL  ? MCH 25.5 (L) 26.6 - 33.0 pg  ? MCHC 33.0 31.5 - 35.7 g/dL  ? RDW 13.7 11.6 - 15.4 %  ? Platelets 292 150 - 450 x10E3/uL  ? Neutrophils 60 Not Estab. %  ? Lymphs 30 Not Estab. %  ? Monocytes 7 Not Estab. %  ? Eos 2 Not Estab. %  ? Basos 1 Not Estab. %  ? Neutrophils Absolute 4.7 1.4 - 7.0 x10E3/uL  ? Lymphocytes Absolute 2.3 0.7 - 3.1 x10E3/uL  ? Monocytes Absolute 0.5 0.1 - 0.9 x10E3/uL  ? EOS (ABSOLUTE) 0.2 0.0 - 0.4 x10E3/uL  ? Basophils Absolute 0.1 0.0 - 0.2 x10E3/uL  ?  Immature Granulocytes 0 Not Estab. %  ?  Immature Grans (Abs) 0.0 0.0 - 0.1 x10E3/uL  ?Comprehensive metabolic panel     Status: Abnormal  ? Collection Time: 08/06/21  3:03 PM  ?Result Value Ref Range  ? Glucose 136 (H) 70 - 99 mg/dL  ? BUN 13 6 - 20 mg/dL  ? Creatinine, Ser 0.87 0.76 - 1.27 mg/dL  ? eGFR 119 >59 mL/min/1.73  ? BUN/Creatinine Ratio 15 9 - 20  ? Sodium 140 134 - 144 mmol/L  ? Potassium 3.9 3.5 - 5.2 mmol/L  ? Chloride 101 96 - 106 mmol/L  ? CO2 20 20 - 29 mmol/L  ? Calcium 9.2 8.7 - 10.2 mg/dL  ? Total Protein 6.4 6.0 - 8.5 g/dL  ? Albumin 4.7 4.1 - 5.2 g/dL  ? Globulin, Total 1.7 1.5 - 4.5 g/dL  ? Albumin/Globulin Ratio 2.8 (H) 1.2 - 2.2  ? Bilirubin Total <0.2 0.0 - 1.2 mg/dL  ? Alkaline Phosphatase 99 44 - 121 IU/L  ? AST 19 0 - 40 IU/L  ? ALT 21 0 - 44 IU/L  ?TSH     Status: None  ? Collection Time: 08/06/21  3:03 PM  ?Result Value Ref Range  ? TSH 0.873 0.450 - 4.500 uIU/mL  ? ? ? ?PHQ2/9: ? ?  10/19/2021  ?  9:03 AM 08/06/2021  ?  2:52 PM 11/06/2019  ?  9:26 AM 09/14/2018  ?  3:38 PM 05/09/2018  ?  9:34 AM  ?Depression screen PHQ 2/9  ?Decreased Interest 1 0 _0 ?Down, Depressed, Hopeless 3 0 _1 ?PHQ - 2 Score 4 0 _2 ?Altered sleeping 3 0 _3 ?Tired, decreased energy _4 ?Change in appetite _5 ?Feeling bad or failure about yourself  _6 ?Trouble concentrating 2 0 _7 ?Moving slowly or fidgety/restless 0 _8 ?Suicidal thoughts 0 0 0 0 0  ?PHQ-9 Score _9 ?Difficult doing work/chores Somewhat difficult Somewhat difficult     ?  ? ? ?Fall Risk: ? ?  10/19/2021  ?  9:03 AM 08/06/2021  ?  2:52 PM 05/09/2018  ?  9:34 AM 09/14/2017  ?  8:33 AM 04/04/2017  ?  3:41 PM  ?Fall Risk   ?Falls in the past year? 0 0 0 No No  ?Number falls in past yr: 0 0 0    ?Injury with Fall? 0 0 0    ?Risk for fall due to : No Fall Risks No Fall Risks     ?Follow up Falls evaluation completed Falls evaluation completed     ? ? ? ? ?Functional Status Survey: ?  ? ? ? ?Assessment & Plan ? ?Problem List Items Addressed This  Visit   ?None ?Visit Diagnoses   ? ? Sore throat    -  Primary ?Acute, new problem ?Reports ongoing, intermittent sore throat for the past 2 weeks, has tried using at home measures and took his children's

## 2021-10-19 NOTE — Patient Instructions (Addendum)
I recommend the following to assist with your symptoms ? ?You can use over the counter medications such as Dayquil/Nyquil, AlkaSeltzer formulations, etc to provide further relief of symptoms according to the manufacturer's instructions  ?If preferred you can use Coricidin to manage your symptoms rather than those medications mentioned above.  ? ?You can take Tylenol and Ibuprofen as needed for pain/ discomfort. Tylenol is usually included in the medications listed above so I recommend alternating Ibuprofen as needed to further assist with pain  ? ?We will send a swab for strep culture and keep you updated as those results become available. ? ?If your symptoms do not improve or become worse in the next 5-7 days please make an apt at the office so we can see you  ? ?Go to the ER if you begin to have more serious symptoms such as shortness of breath, trouble breathing, loss of consciousness, swelling around the eyes, high fever, severe lasting headaches, vision changes or neck pain/stiffness.  ? ?

## 2021-10-22 LAB — CULTURE, GROUP A STREP: Strep A Culture: NEGATIVE

## 2021-10-22 LAB — RAPID STREP SCREEN (MED CTR MEBANE ONLY): Strep Gp A Ag, IA W/Reflex: NEGATIVE

## 2021-10-26 DIAGNOSIS — J069 Acute upper respiratory infection, unspecified: Secondary | ICD-10-CM | POA: Diagnosis not present

## 2021-10-26 DIAGNOSIS — R21 Rash and other nonspecific skin eruption: Secondary | ICD-10-CM | POA: Diagnosis not present

## 2021-11-06 ENCOUNTER — Ambulatory Visit: Payer: BC Managed Care – PPO | Admitting: Internal Medicine

## 2021-11-10 ENCOUNTER — Ambulatory Visit: Payer: BC Managed Care – PPO | Admitting: Unknown Physician Specialty

## 2021-11-13 ENCOUNTER — Other Ambulatory Visit: Payer: Self-pay | Admitting: Internal Medicine

## 2021-11-13 DIAGNOSIS — F3342 Major depressive disorder, recurrent, in full remission: Secondary | ICD-10-CM

## 2021-11-13 NOTE — Telephone Encounter (Signed)
Requested Prescriptions  Pending Prescriptions Disp Refills  . citalopram (CELEXA) 40 MG tablet [Pharmacy Med Name: CITALOPRAM '40MG'$  TABLETS] 90 tablet 0    Sig: TAKE 1 TABLET(40 MG) BY MOUTH DAILY     Psychiatry:  Antidepressants - SSRI Passed - 11/13/2021  2:39 PM      Passed - Completed PHQ-2 or PHQ-9 in the last 360 days      Passed - Valid encounter within last 6 months    Recent Outpatient Visits          3 weeks ago Sore throat   [facility] Mecum, Erin E, PA-C   3 months ago Tingling pain   [facility], Avanti, MD   11 months ago Chronic back pain, unspecified back location, unspecified back pain laterality   [facility] Vigg, Avanti, MD   2 years ago Recurrent major depressive disorder, in full remission [facility])   [facility] Volney American, [place]   2 years ago Left sided sciatica   [place], Lilia Argue, [place]      Future Appointments            In 1 month Rollene Rotunda, Jaci Standard, [place], [place]

## 2021-12-17 NOTE — Progress Notes (Unsigned)
New patient visit   Patient: Connor Wilson   DOB: 1991-06-07   31 y.o. Male  MRN: [id number] Visit Date: 12/23/2021  Today's healthcare provider: Gwyneth Sprout, FNP   No chief complaint on file.  Subjective    Connor Wilson is a 31 y.o. male who presents today as a new patient to establish care.  HPI  ***  Past Medical History:  Diagnosis Date   ADD (attention deficit disorder)    Depression    Past Surgical History:  Procedure Laterality Date   FRACTURE SURGERY Right 2010   leg   GASTRIC OUTLET OBSTRUCTION RELEASE     53 weeks old   lymph node removal Left    LYMPHADENECTOMY     Family Status  Relation Name Status   Father  Alive   Mother  Alive   MGM  (Not Specified)   Family History  Problem Relation Age of Onset   Hyperlipidemia Father    Diabetes Mother    Cancer Maternal Grandmother    Social History   Socioeconomic History   Marital status: Single    Spouse name: Not on file   Number of children: Not on file   Years of education: Not on file   Highest education level: Not on file  Occupational History   Not on file  Tobacco Use   Smoking status: Former    Types: Cigarettes    Quit date: 01/28/2011    Years since quitting: 10.8   Smokeless tobacco: Never  Vaping Use   Vaping Use: Never used  Substance and Sexual Activity   Alcohol use: Yes    Alcohol/week: 8.0 standard drinks of alcohol    Types: 8 Glasses of wine per week    Comment: occasional   Drug use: No    Types: Marijuana   Sexual activity: Not on file  Other Topics Concern   Not on file  Social History Narrative   Not on file   Social Determinants of Health   Financial Resource Strain: Not on file  Food Insecurity: Not on file  Transportation Needs: Not on file  Physical Activity: Not on file  Stress: Not on file  Social Connections: Not on file   Outpatient Medications Prior to Visit  Medication Sig   citalopram (CELEXA) 40 MG tablet TAKE 1 TABLET(40 MG) BY  MOUTH DAILY   famotidine (PEPCID) 20 MG tablet Take 1 tablet (20 mg total) by mouth 2 (two) times daily.   No facility-administered medications prior to visit.   Allergies  Allergen Reactions   Abilify [Aripiprazole] Other (See Comments)    sleepy    Immunization History  Administered Date(s) Administered   Janssen (J&J) SARS-COV-2 Vaccination 01/22/2020   Td 12/05/2009    Health Maintenance  Topic Date Due   HIV Screening  Never done   Hepatitis C Screening  Never done   TETANUS/TDAP  12/06/2019   COVID-19 Vaccine (2 - Booster for Janssen series) 03/18/2020   INFLUENZA VACCINE  01/05/2022   HPV VACCINES  Aged Out    Patient Care Team: Charlynne Cousins, MD as PCP - General  Review of Systems  {Labs  Heme  Chem  Endocrine  Serology  Results Review (optional):23779}   Objective    There were no vitals taken for this visit. {Show previous vital signs (optional):23777}  Physical Exam ***  Depression Screen    10/19/2021    9:03 AM 08/06/2021    2:52 PM 11/06/2019  9:26 AM 09/14/2018    3:38 PM  PHQ 2/9 Scores  PHQ - 2 Score 4 0 4 2  PHQ- 9 Score '14 6 13 13   '$ No results found for any visits on 12/23/21.  Assessment & Plan     ***  No follow-ups on file.     {provider attestation***:1}   Gwyneth Sprout, [place] [phone] (phone) [phone] (fax)  [place]

## 2021-12-23 ENCOUNTER — Ambulatory Visit (INDEPENDENT_AMBULATORY_CARE_PROVIDER_SITE_OTHER): Payer: BC Managed Care – PPO | Admitting: Family Medicine

## 2021-12-23 ENCOUNTER — Encounter: Payer: Self-pay | Admitting: Family Medicine

## 2021-12-23 VITALS — BP 125/86 | HR 78 | Temp 97.8°F | Resp 16 | Wt 151.0 lb

## 2021-12-23 DIAGNOSIS — F1910 Other psychoactive substance abuse, uncomplicated: Secondary | ICD-10-CM

## 2021-12-23 DIAGNOSIS — F419 Anxiety disorder, unspecified: Secondary | ICD-10-CM

## 2021-12-23 DIAGNOSIS — F339 Major depressive disorder, recurrent, unspecified: Secondary | ICD-10-CM

## 2021-12-23 DIAGNOSIS — Z23 Encounter for immunization: Secondary | ICD-10-CM | POA: Diagnosis not present

## 2021-12-23 DIAGNOSIS — F10929 Alcohol use, unspecified with intoxication, unspecified: Secondary | ICD-10-CM

## 2021-12-23 MED ORDER — BUSPIRONE HCL 5 MG PO TABS: 5.0000 mg | ORAL_TABLET | Freq: Two times a day (BID) | ORAL | 3 refills | Status: DC

## 2021-12-23 MED ORDER — CITALOPRAM HYDROBROMIDE 10 MG PO TABS: ORAL_TABLET | ORAL | 0 refills | Status: DC

## 2021-12-23 NOTE — Assessment & Plan Note (Signed)
Chronic, stable Reports history of DWI/DUI  Continue to drink; reports drinking is a crutch and not for enjoyment

## 2021-12-23 NOTE — Assessment & Plan Note (Signed)
Chronic, waxes/wanes Previous use of multiple SSRI/SNRI/atypicals Recommend use of Buspar, 5 mg, BID to assist with wean from Celexa- patient request titration to assist with symptoms given concern that medication may not be working as wlel for patient as initially was Titration schedule discussed with slow week over 6 weeks with use of 1/2 of 40 mg and addition of 10 mg tablets to assist in 30 mg dose. 6-8 wks f/u recommend Contracted to safety; denies SI/HI

## 2021-12-23 NOTE — Assessment & Plan Note (Signed)
Chronic, stable Reports history of illicit drugs and MJ use; in addition to tobacco and ETOH

## 2021-12-23 NOTE — Assessment & Plan Note (Signed)
Chronic, stable Chronic, waxes/wanes Previous use of multiple SSRI/SNRI/atypicals With coexisting anxiety Previously seen at [facility]; therapist left Unable to seek return counsel d/t financials at this time Discussed use of CCM if needed to assist with local resources and personal tools

## 2021-12-24 ENCOUNTER — Other Ambulatory Visit: Payer: Self-pay | Admitting: Family Medicine

## 2021-12-24 ENCOUNTER — Telehealth: Payer: Self-pay

## 2021-12-24 DIAGNOSIS — F339 Major depressive disorder, recurrent, unspecified: Secondary | ICD-10-CM

## 2021-12-24 DIAGNOSIS — F419 Anxiety disorder, unspecified: Secondary | ICD-10-CM

## 2021-12-24 MED ORDER — CITALOPRAM HYDROBROMIDE 10 MG PO TABS: 10.0000 mg | ORAL_TABLET | Freq: Every day | ORAL | 0 refills | Status: DC

## 2021-12-24 NOTE — Telephone Encounter (Signed)
Katie from [employer] called and stated that she needed clarification on the sig for Citalopram ( Celexa) 10 mg. She states that she needs the direction to support specific day and time how pt should take medication. She states it is for insurance purposes, so they can cover medication. Please advise. Best contact for [place], [phone]) [phone].

## 2022-01-01 ENCOUNTER — Ambulatory Visit: Payer: Self-pay

## 2022-01-01 ENCOUNTER — Emergency Department: Payer: BC Managed Care – PPO

## 2022-01-01 ENCOUNTER — Emergency Department
Admission: EM | Admit: 2022-01-01 | Discharge: 2022-01-02 | Disposition: A | Discharge status: Home / Self Care | Payer: BC Managed Care – PPO | Attending: Emergency Medicine | Admitting: Emergency Medicine

## 2022-01-01 ENCOUNTER — Other Ambulatory Visit: Payer: Self-pay

## 2022-01-01 ENCOUNTER — Encounter: Payer: Self-pay | Admitting: Emergency Medicine

## 2022-01-01 DIAGNOSIS — R1011 Right upper quadrant pain: Secondary | ICD-10-CM | POA: Diagnosis not present

## 2022-01-01 DIAGNOSIS — D1803 Hemangioma of intra-abdominal structures: Secondary | ICD-10-CM | POA: Diagnosis not present

## 2022-01-01 DIAGNOSIS — R109 Unspecified abdominal pain: Secondary | ICD-10-CM

## 2022-01-01 LAB — BASIC METABOLIC PANEL
Anion gap: 8 (ref 5–15)
BUN: 11 mg/dL (ref 6–20)
CO2: 28 mmol/L (ref 22–32)
Calcium: 9.3 mg/dL (ref 8.9–10.3)
Chloride: 104 mmol/L (ref 98–111)
Creatinine, Ser: 0.86 mg/dL (ref 0.61–1.24)
GFR, Estimated: >60 mL/min (ref >60)
Glucose, Bld: 94 mg/dL (ref 70–99)
Potassium: 3.6 mmol/L (ref 3.5–5.1)
Sodium: 140 mmol/L (ref 135–145)

## 2022-01-01 LAB — URINALYSIS, ROUTINE W REFLEX MICROSCOPIC
Bilirubin Urine: NEGATIVE
Glucose, UA: NEGATIVE mg/dL
Hgb urine dipstick: NEGATIVE
Ketones, ur: NEGATIVE mg/dL
Leukocytes,Ua: NEGATIVE
Nitrite: NEGATIVE
Protein, ur: NEGATIVE mg/dL
Specific Gravity, Urine: 1.016 (ref 1.005–1.030)
pH: 6 (ref 5.0–8.0)

## 2022-01-01 LAB — CBC
HCT: 48.3 % (ref 39.0–52.0)
Hemoglobin: 15.1 g/dL (ref 13.0–17.0)
MCH: 25.1 pg — ABNORMAL LOW (ref 26.0–34.0)
MCHC: 31.3 g/dL (ref 30.0–36.0)
MCV: 80.4 fL (ref 80.0–100.0)
Platelets: 308 10*3/uL (ref 150–400)
RBC: 6.01 MIL/uL — ABNORMAL HIGH (ref 4.22–5.81)
RDW: 13.1 % (ref 11.5–15.5)
WBC: 7.8 10*3/uL (ref 4.0–10.5)
nRBC: 0 % (ref 0.0–0.2)

## 2022-01-01 LAB — HEPATIC FUNCTION PANEL
ALT: 28 U/L (ref 0–44)
AST: 23 U/L (ref 15–41)
Albumin: 4.5 g/dL (ref 3.5–5.0)
Alkaline Phosphatase: 91 U/L (ref 38–126)
Bilirubin, Direct: 0.1 mg/dL (ref 0.0–0.2)
Indirect Bilirubin: 0.9 mg/dL (ref 0.3–0.9)
Total Bilirubin: 1 mg/dL (ref 0.3–1.2)
Total Protein: 7.3 g/dL (ref 6.5–8.1)

## 2022-01-01 LAB — LIPASE, BLOOD: Lipase: 29 U/L (ref 11–51)

## 2022-01-01 MED ORDER — MORPHINE SULFATE (PF) 4 MG/ML IV SOLN
4.0000 mg | Freq: Once | INTRAVENOUS | Status: AC
  Administered 2022-01-01: 4 mg via INTRAVENOUS
  Filled 2022-01-01: qty 1

## 2022-01-01 MED ORDER — LACTATED RINGERS IV BOLUS
1000.0000 mL | Freq: Once | INTRAVENOUS | Status: AC
  Administered 2022-01-01: 1000 mL via INTRAVENOUS

## 2022-01-01 MED ORDER — KETOROLAC TROMETHAMINE 30 MG/ML IJ SOLN
15.0000 mg | Freq: Once | INTRAMUSCULAR | Status: AC
  Administered 2022-01-01: 15 mg via INTRAVENOUS
  Filled 2022-01-01: qty 1

## 2022-01-01 MED ORDER — IOHEXOL 350 MG/ML SOLN
75.0000 mL | Freq: Once | INTRAVENOUS | Status: AC | PRN
  Administered 2022-01-01: 75 mL via INTRAVENOUS

## 2022-01-01 MED ORDER — ONDANSETRON HCL 4 MG/2ML IJ SOLN
4.0000 mg | Freq: Once | INTRAMUSCULAR | Status: AC
  Administered 2022-01-01: 4 mg via INTRAVENOUS
  Filled 2022-01-01: qty 2

## 2022-01-01 NOTE — ED Notes (Signed)
Patient was given a  glass of water and told his IV could come out when he is discharged.

## 2022-01-01 NOTE — Telephone Encounter (Signed)
  Chief Complaint: upper abdominal pain and back pain Symptoms: headache  Frequency: Since Wednesday Pertinent Negatives: Patient denies vomiting, SOB, fever, diarrhea Disposition: '[x]'$ ED /'[]'$ Urgent Care (no appt availability in office) / '[]'$ Appointment(In office/virtual)/ '[]'$  [facility]/ '[]'$ Home Care/ '[]'$ Refused Recommended Disposition /'[]'$ [facility]/ '[]'$  Follow-up with PCP Additional Notes: Advised to be driven to the nearest ED Reason for Disposition  [1] SEVERE pain (e.g., excruciating) AND [2] present > 1 hour  Answer Assessment - Initial Assessment Questions 1. LOCATION: "Where does it hurt?"      Below rib cage and back is swollen  2. RADIATION: "Does the pain shoot anywhere else?" (e.g., chest, back)     back 3. ONSET: "When did the pain begin?" (Minutes, hours or days ago)  Wednesday 4. SUDDEN: "Gradual or sudden onset?"     gradually 5. PATTERN "Does the pain come and go, or is it constant?"    - If it comes and goes: "How long does it last?" "Do you have pain now?"     (Note: Comes and goes means the pain is intermittent. It goes away completely between bouts.)    - If constant: "Is it getting better, staying the same, or getting worse?"      (Note: Constant means the pain never goes away completely; most serious pain is constant and gets worse.)      constant 6. SEVERITY: "How bad is the pain?"  (e.g., Scale 1-10; mild, moderate, or severe)    - MILD (1-3): Doesn't interfere with normal activities, abdomen soft and not tender to touch.     - MODERATE (4-7): Interferes with normal activities or awakens from sleep, abdomen tender to touch.     - SEVERE (8-10): Excruciating pain, doubled over, unable to do any normal activities.     severe 7. RECURRENT SYMPTOM: "Have you ever had this type of stomach pain before?" If Yes, ask: "When was the last time?" and "What happened that time?"      no 8. CAUSE: "What do you think is causing the stomach pain?"      unsure 9. RELIEVING/AGGRAVATING FACTORS: "What makes it better or worse?" (e.g., antacids, bending or twisting motion, bowel movement)     nothing 10. OTHER SYMPTOMS: "Do you have any other symptoms?" (e.g., back pain, diarrhea, fever, urination pain, vomiting)       Back pain, headache  Protocols used: Abdominal Pain - Male-A-AH

## 2022-01-01 NOTE — ED Notes (Addendum)
Patient left without paper discharge and without staff removing IV. C-Com was called and will check at patient's home. Patient didn't answer phone.

## 2022-01-01 NOTE — ED Notes (Signed)
Officer Environmental consultant contacted [facility] ED tech and stated that patient had  removed the IV himself.

## 2022-01-01 NOTE — ED Provider Notes (Signed)
[facility] Provider Note    Event Date/Time   First MD Initiated Contact with Patient 01/01/22 1108     (approximate)   History   Chief Complaint Flank Pain   HPI  Connor Wilson is a 31 y.o. male with past medical history of alcohol abuse and depression who presents to the ED complaining of flank pain.  Patient reports that he has been dealing with 3 days of constant pain in the right upper quadrant of his abdomen radiating around to his right flank and mid back.  He describes the pain as sharp and constant, not exacerbated or alleviated by anything in particular.  He has not had any nausea or vomiting, states he has been able to eat and drink normally.  He denies any fevers and has not had any dysuria or hematuria.  He denies history of similar symptoms, reports abdominal surgery for a "blockage" when he was about 46 weeks old, denies any abdominal surgeries since then.      Physical Exam   Triage Vital Signs: ED Triage Vitals  Enc Vitals Group     BP 01/01/22 0951 127/87     Pulse Rate 01/01/22 0951 93     Resp 01/01/22 0951 16     Temp 01/01/22 0951 98.2 F (36.8 C)     Temp Source 01/01/22 0951 Oral     SpO2 01/01/22 0951 97 %     Weight 01/01/22 0951 150 lb (68 kg)     Height 01/01/22 0951 '5\' 5"'$  (1.651 m)     Head Circumference --      Peak Flow --      Pain Score 01/01/22 0950 7     Pain Loc --      Pain Edu? --      Excl. in [place]? --     Most recent vital signs: Vitals:   01/01/22 1502 01/01/22 1655  BP: 120/70 139/77  Pulse: 78 68  Resp: 16 16  Temp:    SpO2: 99% 99%    Constitutional: Alert and oriented. Eyes: Conjunctivae are normal. Head: Atraumatic. Nose: No congestion/rhinnorhea. Mouth/Throat: Mucous membranes are moist.  Cardiovascular: Normal rate, regular rhythm. Grossly normal heart sounds.  2+ radial pulses bilaterally. Respiratory: Normal respiratory effort.  No retractions. Lungs CTAB. Gastrointestinal: Soft and  tender to palpation in the right upper quadrant with no rebound or guarding.  No CVA tenderness bilaterally. No distention. Musculoskeletal: No lower extremity tenderness nor edema.  Neurologic:  Normal speech and language. No gross focal neurologic deficits are appreciated.    ED Results / Procedures / Treatments   Labs (all labs ordered are listed, but only abnormal results are displayed) Labs Reviewed  URINALYSIS, ROUTINE W REFLEX MICROSCOPIC - Abnormal; Notable for the following components:      Result Value   Color, Urine YELLOW (*)    APPearance CLEAR (*)    All other components within normal limits  CBC - Abnormal; Notable for the following components:   RBC 6.01 (*)    MCH 25.1 (*)    All other components within normal limits  BASIC METABOLIC PANEL  HEPATIC FUNCTION PANEL  LIPASE, BLOOD   RADIOLOGY Right upper quadrant ultrasound reviewed and interpreted by me with no shadowing gallstones, wall thickening, or pericholecystic fluid.  PROCEDURES:  Critical Care performed: No  Procedures   MEDICATIONS ORDERED IN ED: Medications  morphine (PF) 4 MG/ML injection 4 mg (4 mg Intravenous Given 01/01/22 1223)  ondansetron (  ZOFRAN) injection 4 mg (4 mg Intravenous Given 01/01/22 1223)  lactated ringers bolus 1,000 mL (0 mLs Intravenous Stopped 01/01/22 1403)  iohexol (OMNIPAQUE) 350 MG/ML injection 75 mL (75 mLs Intravenous Contrast Given 01/01/22 1532)  ketorolac (TORADOL) 30 MG/ML injection 15 mg (15 mg Intravenous Given 01/01/22 1649)     IMPRESSION / MDM / ASSESSMENT AND PLAN / ED COURSE  I reviewed the triage vital signs and the nursing notes.                              31 y.o. male with past medical history of alcohol abuse and depression who presents to the ED with 3 days of constant right upper quadrant abdominal pain radiating around to his right flank, denies urinary symptoms.  Patient's presentation is most consistent with acute presentation with potential  threat to life or bodily function.  Differential diagnosis includes, but is not limited to, cholecystitis, biliary colic, hepatitis, pancreatitis, kidney stones, pyelonephritis.  Patient well-appearing and in no acute distress, vital signs are unremarkable.  He has tenderness to palpation in the right upper quadrant of his abdomen and symptoms seem most concerning for a biliary pathology.  Initial labs are reassuring with no significant anemia, leukocytosis, electrolyte abnormality, or AKI.  We will add on LFTs and lipase, check right upper quadrant ultrasound.  Urinalysis is unremarkable with no signs of infection or hematuria, low suspicion for kidney stone at this time.  We will treat symptomatically with IV morphine and Zofran, hydrate with IV fluids and reassess following imaging.  Right upper quadrant ultrasound remarkable only for likely hepatic hemangioma, no evidence of biliary pathology.  LFTs and lipase are also unremarkable.  Patient continues to have right upper quadrant pain on reassessment, CT scan was performed and again unremarkable.  No evidence of kidney stone or other acute process, CT imaging again demonstrates likely hemangioma.  Patient was informed of this finding and counseled to follow-up with his PCP for outpatient MRI.  He is otherwise appropriate for discharge home and was counseled to return to the ED for new or worsening symptoms, patient agrees with plan.      FINAL CLINICAL IMPRESSION(S) / ED DIAGNOSES   Final diagnoses:  Right flank pain  Hepatic hemangioma     Rx / DC Orders   ED Discharge Orders     None        Note:  This document was prepared using Dragon voice recognition software and may include unintentional dictation errors.   Blake Divine, MD 01/01/22 [phone]

## 2022-01-01 NOTE — ED Triage Notes (Signed)
Pt here with right side flank pain that started 2 days ago. Pt states pain does not radiate but it is pulsating. Pt denies N/V/D. Pt denies any urinary symptoms.

## 2022-01-01 NOTE — ED Notes (Signed)
Attempted to contact patient via telephone for removal of IV. Patient did not answer phone. C-Com was contacted and will go to home address for a wellness check and contact ED with any further details.

## 2022-01-25 DIAGNOSIS — T490X5A Adverse effect of local antifungal, anti-infective and anti-inflammatory drugs, initial encounter: Secondary | ICD-10-CM | POA: Diagnosis not present

## 2022-02-24 ENCOUNTER — Ambulatory Visit: Payer: BC Managed Care – PPO | Admitting: Family Medicine

## 2022-03-08 ENCOUNTER — Ambulatory Visit: Payer: BC Managed Care – PPO | Admitting: Family Medicine

## 2022-05-07 ENCOUNTER — Telehealth: Payer: BC Managed Care – PPO | Admitting: Physician Assistant

## 2022-05-07 DIAGNOSIS — H109 Unspecified conjunctivitis: Secondary | ICD-10-CM

## 2022-05-07 MED ORDER — POLYMYXIN B-TRIMETHOPRIM 10000-0.1 UNIT/ML-% OP SOLN: 1.0000 [drp] | OPHTHALMIC | 0 refills | Status: DC

## 2022-05-07 NOTE — Patient Instructions (Signed)
Riley Kill, thank you for joining Mar Daring, PA-C for today's virtual visit.  While this provider is not your primary care provider (PCP), if your PCP is located in our provider database this encounter information will be shared with them immediately following your visit.   [facility] account gives you access to today's visit and all your visits, tests, and labs performed at [facility] " click here if you don't have a [facility] account or go to [email or web address]  Consent: (Patient) Connor Wilson provided verbal consent for this virtual visit at the beginning of the encounter.  Current Medications:  Current Outpatient Medications:    trimethoprim-polymyxin b (POLYTRIM) ophthalmic solution, Place 1 drop into the left eye every 4 (four) hours. X 5 days, Disp: 10 mL, Rfl: 0   busPIRone (BUSPAR) 5 MG tablet, Take 1 tablet (5 mg total) by mouth 2 (two) times daily., Disp: 60 tablet, Rfl: 3   citalopram (CELEXA) 10 MG tablet, Take 1 tablet (10 mg total) by mouth daily., Disp: 90 tablet, Rfl: 0   Medications ordered in this encounter:  Meds ordered this encounter  Medications   trimethoprim-polymyxin b (POLYTRIM) ophthalmic solution    Sig: Place 1 drop into the left eye every 4 (four) hours. X 5 days    Dispense:  10 mL    Refill:  0    Order Specific Question:   Supervising Provider    Answer:   Chase Picket [[id number]]     *If you need refills on other medications prior to your next appointment, please contact your pharmacy*  Follow-Up: Call back or seek an in-person evaluation if the symptoms worsen or if the condition fails to improve as anticipated.  [place] [phone]  Other Instructions Bacterial Conjunctivitis, Adult Bacterial conjunctivitis is an infection of your conjunctiva. This is the clear membrane that covers the white part of your eye and the inner part of your eyelid. This infection can  make your eye: Red or pink. Itchy or irritated. This condition spreads easily from person to person (is contagious) and from one eye to the other eye. What are the causes? This condition is caused by germs (bacteria). You may get the infection if you come into close contact with: A person who has the infection. Items that have germs on them (are contaminated), such as face towels, contact lens solution, or eye makeup. What increases the risk? You are more likely to get this condition if: You have contact with people who have the infection. You wear contact lenses. You have a sinus infection. You have had a recent eye injury or surgery. You have a weak body defense system (immune system). You have dry eyes. What are the signs or symptoms?  Thick, yellowish discharge from the eye. Tearing or watery eyes. Itchy eyes. Burning feeling in your eyes. Eye redness. Swollen eyelids. Blurred vision. How is this treated?  Antibiotic eye drops or ointment. Antibiotic medicine taken by mouth. This is used for infections that do not get better with drops or ointment or that last more than 10 days. Cool, wet cloths placed on the eyes. Artificial tears used 2-6 times a day. Follow these instructions at home: Medicines Take or apply your antibiotic medicine as told by your doctor. Do not stop using it even if you start to feel better. Take or apply over-the-counter and prescription medicines only as told by your doctor. Do not touch your eyelid with the  eye-drop bottle or the ointment tube. Managing discomfort Wipe any fluid from your eye with a warm, wet washcloth or a cotton ball. Place a clean, cool, wet cloth on your eye. Do this for 10-20 minutes, 3-4 times a day. General instructions Do not wear contacts until the infection is gone. Wear glasses until your doctor says it is okay to wear contacts again. Do not wear eye makeup until the infection is gone. Throw away old eye makeup. Change  or wash your pillowcase every day. Do not share towels or washcloths. Wash your hands often with soap and water for at least 20 seconds and especially before touching your face or eyes. Use paper towels to dry your hands. Do not touch or rub your eyes. Do not drive or use heavy machinery if your vision is blurred. Contact a doctor if: You have a fever. You do not get better after 10 days. Get help right away if: You have a fever and your symptoms get worse all of a sudden. You have very bad pain when you move your eye. Your face: Hurts. Is red. Is swollen. You have sudden loss of vision. Summary Bacterial conjunctivitis is an infection of your conjunctiva. This infection spreads easily from person to person. Wash your hands often with soap and water for at least 20 seconds and especially before touching your face or eyes. Use paper towels to dry your hands. Take or apply your antibiotic medicine as told by your doctor. Contact a doctor if you have a fever or you do not get better after 10 days. This information is not intended to replace advice given to you by your health care provider. Make sure you discuss any questions you have with your health care provider. Document Revised: 09/03/2020 Document Reviewed: 09/03/2020 Elsevier Patient Education  [place].    If you have been instructed to have an in-person evaluation today at a local Urgent Care facility, please use the link below. It will take you to a list of all of our available [facility], including address, phone number and hours of operation. Please do not delay care.  [facility]  If you or a family member do not have a primary care provider, use the link below to schedule a visit and establish care. When you choose a [facility] primary care physician or advanced practice provider, you gain a long-term partner in health. Find a Primary Care Provider  Learn more about [facility]  in-office and virtual care options: [facility] Now

## 2022-05-07 NOTE — Progress Notes (Signed)
Virtual Visit Consent   Connor Wilson, you are scheduled for a virtual visit with a [facility] provider today. Just as with appointments in the office, your consent must be obtained to participate. Your consent will be active for this visit and any virtual visit you may have with one of our providers in the next 365 days. If you have a MyChart account, a copy of this consent can be sent to you electronically.  As this is a virtual visit, video technology does not allow for your provider to perform a traditional examination. This may limit your provider's ability to fully assess your condition. If your provider identifies any concerns that need to be evaluated in person or the need to arrange testing (such as labs, EKG, etc.), we will make arrangements to do so. Although advances in technology are sophisticated, we cannot ensure that it will always work on either your end or our end. If the connection with a video visit is poor, the visit may have to be switched to a telephone visit. With either a video or telephone visit, we are not always able to ensure that we have a secure connection.  By engaging in this virtual visit, you consent to the provision of healthcare and authorize for your insurance to be billed (if applicable) for the services provided during this visit. Depending on your insurance coverage, you may receive a charge related to this service.  I need to obtain your verbal consent now. Are you willing to proceed with your visit today? Connor Wilson has provided verbal consent on 05/07/2022 for a virtual visit (video or telephone). Mar Daring, PA-C  Date: 05/07/2022 8:07 AM  Virtual Visit via Video Note   I, Mar Daring, connected with  Connor Wilson  ([id number], [birth year]) on 05/07/22 at  8:00 AM EST by a video-enabled telemedicine application and verified that I am speaking with the correct person using two identifiers.  Location: Patient: Virtual Visit  Location Patient: Home Provider: Virtual Visit Location Provider: Home Office   I discussed the limitations of evaluation and management by telemedicine and the availability of in person appointments. The patient expressed understanding and agreed to proceed.    History of Present Illness: Connor Wilson is a 31 y.o. who identifies as a male who was assigned male at birth, and is being seen today for possible pink eye.  HPI: Conjunctivitis  The current episode started more than 1 week ago. The onset was gradual. The problem has been unchanged. The problem is mild. Nothing relieves the symptoms. Nothing aggravates the symptoms. Associated symptoms include decreased vision, eye itching, eye discharge and eye redness. Pertinent negatives include no fever, no double vision, no photophobia, no congestion, no headaches, no URI and no eye pain. The eye pain is mild. The left eye is affected. The eye pain is not associated with movement. The eyelid exhibits redness.     Problems:  Patient Active Problem List   Diagnosis Date Noted   Anxiety 12/23/2021   Need for Tdap vaccination 12/23/2021   Acute alcohol abuse, with unspecified complication ([place]) 78/29/5621   Substance abuse ([place]) 12/23/2021   Depression, recurrent ([place]) 11/09/2018    Allergies:  Allergies  Allergen Reactions   Abilify [Aripiprazole] Other (See Comments)    sleepy   Medications:  Current Outpatient Medications:    trimethoprim-polymyxin b (POLYTRIM) ophthalmic solution, Place 1 drop into the left eye every 4 (four) hours. X 5 days, Disp: 10 mL, Rfl:  0   busPIRone (BUSPAR) 5 MG tablet, Take 1 tablet (5 mg total) by mouth 2 (two) times daily., Disp: 60 tablet, Rfl: 3   citalopram (CELEXA) 10 MG tablet, Take 1 tablet (10 mg total) by mouth daily., Disp: 90 tablet, Rfl: 0  Observations/Objective: Patient is well-developed, well-nourished in no acute distress.  Resting comfortably at home.  Head is normocephalic, atraumatic.   No labored breathing.  Speech is clear and coherent with logical content.  Patient is alert and oriented at baseline.  Left eye is injected   Assessment and Plan: 1. Bacterial conjunctivitis of left eye - trimethoprim-polymyxin b (POLYTRIM) ophthalmic solution; Place 1 drop into the left eye every 4 (four) hours. X 5 days  Dispense: 10 mL; Refill: 0  - Suspect bacterial conjunctivitis - Polytrim prescribed - Warm compresses - Good hand hygiene - Seek in person evaluation if symptoms worsen or fail to improve   Follow Up Instructions: I discussed the assessment and treatment plan with the patient. The patient was provided an opportunity to ask questions and all were answered. The patient agreed with the plan and demonstrated an understanding of the instructions.  A copy of instructions were sent to the patient via MyChart unless otherwise noted below.    The patient was advised to call back or seek an in-person evaluation if the symptoms worsen or if the condition fails to improve as anticipated.  Time:  I spent 10 minutes with the patient via telehealth technology discussing the above problems/concerns.    Mar Daring, PA-C

## 2022-05-16 ENCOUNTER — Other Ambulatory Visit: Payer: Self-pay | Admitting: Family Medicine

## 2022-05-16 DIAGNOSIS — F339 Major depressive disorder, recurrent, unspecified: Secondary | ICD-10-CM

## 2022-05-16 DIAGNOSIS — F419 Anxiety disorder, unspecified: Secondary | ICD-10-CM

## 2022-06-09 DIAGNOSIS — N481 Balanitis: Secondary | ICD-10-CM | POA: Diagnosis not present

## 2022-08-04 DIAGNOSIS — L309 Dermatitis, unspecified: Secondary | ICD-10-CM | POA: Diagnosis not present

## 2022-08-16 ENCOUNTER — Other Ambulatory Visit: Payer: Self-pay | Admitting: Family Medicine

## 2022-08-16 DIAGNOSIS — F339 Major depressive disorder, recurrent, unspecified: Secondary | ICD-10-CM

## 2022-08-16 DIAGNOSIS — F419 Anxiety disorder, unspecified: Secondary | ICD-10-CM

## 2022-08-16 NOTE — Telephone Encounter (Unsigned)
Copied from [place] [id number]. Topic: General - Other >> Aug 16, 2022  9:20 AM Everette C wrote: Reason for CRM: Medication Refill - Medication: citalopram (CELEXA) 10 MG tablet [id number]  Has the patient contacted their pharmacy? Yes.   (Agent: If no, request that the patient contact the pharmacy for the refill. If patient does not wish to contact the pharmacy document the reason why and proceed with request.) (Agent: If yes, when and what did the pharmacy advise?)  Preferred Pharmacy (with phone number or street name): [facility] [place], [place] - [address] [place] [address] Phone: [phone] Fax: [phone] Hours: Not open 24 hours   Has the patient been seen for an appointment in the last year OR does the patient have an upcoming appointment? Yes.    Agent: Please be advised that RX refills may take up to 3 business days. We ask that you follow-up with your pharmacy.

## 2022-08-17 MED ORDER — CITALOPRAM HYDROBROMIDE 10 MG PO TABS: ORAL_TABLET | ORAL | 0 refills | Status: DC

## 2022-08-17 NOTE — Telephone Encounter (Signed)
Patient needs OV, will refill medication for 30 days until OV can be made. OV needed for additional refills.  Requested Prescriptions  Pending Prescriptions Disp Refills   citalopram (CELEXA) 10 MG tablet 30 tablet 0    Sig: TAKE 1 TABLET(10 MG) BY MOUTH DAILY     Psychiatry:  Antidepressants - SSRI Failed - 08/16/2022  9:46 AM      Failed - Valid encounter within last 6 months    Recent Outpatient Visits           7 months ago Depression, recurrent [facility])   [place] T[place] Joe T, FNP   10 months ago Sore throat   [place] [facility] Mecum, Dani Gobble, PA-C   1 year ago Tingling pain   [place] [facility] Vigg, Avanti, MD   1 year ago Chronic back pain, unspecified back location, unspecified back pain laterality   [place] [facility] Vigg, Avanti, MD   2 years ago Recurrent major depressive disorder, in full remission [facility])   [place], [place], [place]              Passed - Completed PHQ-2 or PHQ-9 in the last 360 days

## 2022-08-17 NOTE — Telephone Encounter (Signed)
Called to schedule OV for medication refills. No answer, left VM to call back to schedule appointment.

## 2022-09-07 ENCOUNTER — Telehealth (INDEPENDENT_AMBULATORY_CARE_PROVIDER_SITE_OTHER): Payer: BC Managed Care – PPO | Admitting: Family Medicine

## 2022-09-07 DIAGNOSIS — F339 Major depressive disorder, recurrent, unspecified: Secondary | ICD-10-CM

## 2022-09-07 DIAGNOSIS — F1021 Alcohol dependence, in remission: Secondary | ICD-10-CM

## 2022-09-07 DIAGNOSIS — F332 Major depressive disorder, recurrent severe without psychotic features: Secondary | ICD-10-CM | POA: Insufficient documentation

## 2022-09-07 DIAGNOSIS — F419 Anxiety disorder, unspecified: Secondary | ICD-10-CM | POA: Diagnosis not present

## 2022-09-07 DIAGNOSIS — F1910 Other psychoactive substance abuse, uncomplicated: Secondary | ICD-10-CM | POA: Diagnosis not present

## 2022-09-07 MED ORDER — CITALOPRAM HYDROBROMIDE 10 MG PO TABS: ORAL_TABLET | ORAL | 1 refills | Status: DC

## 2022-09-07 NOTE — Assessment & Plan Note (Signed)
Chronic, is NOT sober Continues to drink; however, reports stable relationship with alcohol. Failed to quantify.  Of note, patient with previous DWI

## 2022-09-07 NOTE — Progress Notes (Signed)
I,Joseline E Rosas,acting as a scribe for Gwyneth Sprout, FNP.,have documented all relevant documentation on the behalf of Gwyneth Sprout, FNP,as directed by  Gwyneth Sprout, FNP while in the presence of Gwyneth Sprout, FNP.   MyChart Video Visit  Virtual Visit via Video Note   This format is felt to be most appropriate for this patient at this time. Physical exam was limited by quality of the video and audio technology used for the visit.   Patient location: home Provider location: [facility] [address]  [address] [place], [place] [address]   I discussed the limitations of evaluation and management by telemedicine and the availability of in person appointments. The patient expressed understanding and agreed to proceed.  Patient: Connor Wilson   DOB: [birth year]   32 y.o. Male  MRN: [id number] Visit Date: 09/07/2022  Today's healthcare provider: Gwyneth Sprout, FNP   Chief Complaint  Patient presents with   Follow-up   Subjective    HPI  Anxiety, Follow-up  He was last seen for anxiety 8 months ago. Changes made at last visit include continue Buspar 5 mg and Celexa 10 mg.   He reports fair compliance with treatment. He reports fair tolerance of treatment.  He feels his anxiety is mild and Unchanged since last visit.  Patients needs citalopram (celexa) medication refilled.   Symptoms: No chest pain No difficulty concentrating  No dizziness No fatigue  No feelings of losing control No insomnia  No irritable No palpitations  No panic attacks No racing thoughts  No shortness of breath No sweating  No tremors/shakes    GAD-7 Results    09/07/2022    9:05 AM 10/19/2021    9:04 AM 08/06/2021    2:53 PM  GAD-7 Generalized Anxiety Disorder Screening Tool  1. Feeling Nervous, Anxious, or on Edge 3 1 2   2. Not Being Able to Stop or Control Worrying 2 3 3   3. Worrying Too Much About Different Things 0 3 3  4. Trouble Relaxing 0 1 2  5. Being So  Restless it's Hard To Sit Still 0 0 0  6. Becoming Easily Annoyed or Irritable 1 2 2   7. Feeling Afraid As If Something Awful Might Happen 0 1 1  Total GAD-7 Score 6 11 13   Difficulty At Work, Home, or Getting  Along With Others? Somewhat difficult Somewhat difficult Somewhat difficult    PHQ-9 Scores    09/07/2022    9:07 AM 12/23/2021    2:15 PM 10/19/2021    9:03 AM  PHQ9 SCORE ONLY  PHQ-9 Total Score 3 10 14     ---------------------------------------------------------------------------------------------------    Medications: Outpatient Medications Prior to Visit  Medication Sig   busPIRone (BUSPAR) 5 MG tablet Take 1 tablet (5 mg total) by mouth 2 (two) times daily.   trimethoprim-polymyxin b (POLYTRIM) ophthalmic solution Place 1 drop into the left eye every 4 (four) hours. X 5 days   [DISCONTINUED] citalopram (CELEXA) 10 MG tablet TAKE 1 TABLET(10 MG) BY MOUTH DAILY   No facility-administered medications prior to visit.    Review of Systems   Objective    There were no vitals taken for this visit.  Physical Exam Constitutional:      Appearance: Normal appearance.  Pulmonary:     Effort: Pulmonary effort is normal.  Neurological:     General: No focal deficit present.     Mental Status: He is alert and oriented to person, place,  and time.  Psychiatric:        Mood and Affect: Mood normal.        Behavior: Behavior normal.        Thought Content: Thought content normal.        Judgment: Judgment normal.     Assessment & Plan     Problem List Items Addressed This Visit       Other   Anxiety    Chronic, improved Continues on celexa 10 mg daily Declines further refill of buspar to assist as needed given poor improvement when tried previously       Relevant Medications   citalopram (CELEXA) 10 MG tablet   Depression, recurrent    Chronic, moderate, improved Continue celexa 10 mg Denies SI or HI Continue to moderate THC, ETOH given previous misuse  including other substances (cocaine) I've explained to him that drugs of the SSRI class can have side effects such as weight gain, sexual dysfunction, insomnia, headache, nausea. These medications are generally effective at alleviating symptoms of anxiety and/or depression. Let me know if significant side effects do occur.       Relevant Medications   citalopram (CELEXA) 10 MG tablet   Personal history of alcoholism    Chronic, is NOT sober Continues to drink; however, reports stable relationship with alcohol. Failed to quantify.  Of note, patient with previous DWI      Severe episode of recurrent major depressive disorder, without psychotic features - Primary    Chronic, improved Continue previous dose of Celexa 10 mg       Relevant Medications   citalopram (CELEXA) 10 MG tablet   Substance abuse    Chronic, stable Reports history of illicit drugs (cocaine) and MJ use; in addition to tobacco and ETOH      No follow-ups on file.    I discussed the assessment and treatment plan with the patient. The patient was provided an opportunity to ask questions and all were answered. The patient agreed with the plan and demonstrated an understanding of the instructions.   The patient was advised to call back or seek an in-person evaluation if the symptoms worsen or if the condition fails to improve as anticipated.  I provided 10 minutes of face-to-face time during this encounter discussing chronic depression, symptomatic anxiety and previous substance use/misuse.  Vonna Kotyk, FNP, have reviewed all documentation for this visit. The documentation on 09/07/22 for the exam, diagnosis, procedures, and orders are all accurate and complete.  Gwyneth Sprout, [place] [phone] (phone) [phone] (fax)  [place]

## 2022-09-07 NOTE — Assessment & Plan Note (Signed)
Chronic, improved Continue previous dose of Celexa 10 mg

## 2022-09-07 NOTE — Assessment & Plan Note (Signed)
Chronic, moderate, improved Continue celexa 10 mg Denies SI or HI Continue to moderate THC, ETOH given previous misuse including other substances (cocaine) I've explained to him that drugs of the SSRI class can have side effects such as weight gain, sexual dysfunction, insomnia, headache, nausea. These medications are generally effective at alleviating symptoms of anxiety and/or depression. Let me know if significant side effects do occur.

## 2022-09-07 NOTE — Assessment & Plan Note (Signed)
Chronic, stable Reports history of illicit drugs (cocaine) and MJ use; in addition to tobacco and ETOH

## 2022-09-07 NOTE — Assessment & Plan Note (Signed)
Chronic, improved Continues on celexa 10 mg daily Declines further refill of buspar to assist as needed given poor improvement when tried previously

## 2023-01-24 DIAGNOSIS — Z1159 Encounter for screening for other viral diseases: Secondary | ICD-10-CM | POA: Diagnosis not present

## 2023-01-24 DIAGNOSIS — Z0000 Encounter for general adult medical examination without abnormal findings: Secondary | ICD-10-CM | POA: Diagnosis not present

## 2023-01-24 DIAGNOSIS — F3341 Major depressive disorder, recurrent, in partial remission: Secondary | ICD-10-CM | POA: Diagnosis not present

## 2023-01-24 DIAGNOSIS — J069 Acute upper respiratory infection, unspecified: Secondary | ICD-10-CM | POA: Diagnosis not present

## 2023-01-24 DIAGNOSIS — Z23 Encounter for immunization: Secondary | ICD-10-CM | POA: Diagnosis not present

## 2023-03-03 ENCOUNTER — Other Ambulatory Visit: Payer: Self-pay | Admitting: Family Medicine

## 2023-03-03 DIAGNOSIS — F332 Major depressive disorder, recurrent severe without psychotic features: Secondary | ICD-10-CM

## 2023-03-03 DIAGNOSIS — F419 Anxiety disorder, unspecified: Secondary | ICD-10-CM

## 2023-03-03 NOTE — Telephone Encounter (Signed)
Requested Prescriptions  Pending Prescriptions Disp Refills   citalopram (CELEXA) 10 MG tablet [Pharmacy Med Name: CITALOPRAM 10MG  TABLETS] 90 tablet 0    Sig: TAKE 1 TABLET(10 MG) BY MOUTH DAILY     Psychiatry:  Antidepressants - SSRI Passed - 03/03/2023  3:26 AM      Passed - Completed PHQ-2 or PHQ-9 in the last 360 days      Passed - Valid encounter within last 6 months    Recent Outpatient Visits           5 months ago Severe episode of recurrent major depressive disorder, without psychotic features [facility])   [place] [facility] Merita Norton T, FNP   1 year ago Depression, recurrent [facility])   [place] [facility] Merita Norton T, FNP   1 year ago Sore throat   [place] [facility] Mecum, Oswaldo Conroy, PA-C   1 year ago Tingling pain   [facility] Vigg, Avanti, MD   2 years ago Chronic back pain, unspecified back location, unspecified back pain laterality   [facility] Loura Pardon, MD

## 2023-08-29 ENCOUNTER — Telehealth: Payer: Self-pay | Admitting: Family Medicine

## 2023-08-29 NOTE — Telephone Encounter (Signed)
 [facility] pharmacy is requesting refill citalopram (CELEXA) 10 MG tablet  Please advise

## 2023-08-30 ENCOUNTER — Other Ambulatory Visit: Payer: Self-pay

## 2023-08-30 DIAGNOSIS — F419 Anxiety disorder, unspecified: Secondary | ICD-10-CM

## 2023-08-30 DIAGNOSIS — F332 Major depressive disorder, recurrent severe without psychotic features: Secondary | ICD-10-CM

## 2023-08-30 NOTE — Telephone Encounter (Signed)
 Converted to RF req
